# Patient Record
Sex: Female | Born: 1935 | Race: White | Hispanic: No | Smoking: Former smoker
Health system: Southern US, Community
[De-identification: ages and names within clinical notes are randomized; demographics above are authoritative.]

## PROBLEM LIST (undated history)

## (undated) DIAGNOSIS — K589 Irritable bowel syndrome without diarrhea: Secondary | ICD-10-CM

## (undated) DIAGNOSIS — B019 Varicella without complication: Secondary | ICD-10-CM

## (undated) DIAGNOSIS — J449 Chronic obstructive pulmonary disease, unspecified: Secondary | ICD-10-CM

## (undated) DIAGNOSIS — G250 Essential tremor: Secondary | ICD-10-CM

## (undated) DIAGNOSIS — I10 Essential (primary) hypertension: Secondary | ICD-10-CM

## (undated) DIAGNOSIS — F419 Anxiety disorder, unspecified: Secondary | ICD-10-CM

## (undated) HISTORY — PX: ABDOMINAL HYSTERECTOMY: SHX81

## (undated) HISTORY — DX: Irritable bowel syndrome, unspecified: K58.9

## (undated) HISTORY — PX: APPENDECTOMY: SHX54

## (undated) HISTORY — PX: OVARIAN CYST REMOVAL: SHX89

## (undated) HISTORY — PX: CHOLECYSTECTOMY: SHX55

## (undated) HISTORY — PX: SMALL INTESTINE SURGERY: SHX150

## (undated) HISTORY — DX: Varicella without complication: B01.9

---

## 2005-06-24 ENCOUNTER — Ambulatory Visit: Payer: Self-pay | Admitting: Family Medicine

## 2006-06-25 ENCOUNTER — Ambulatory Visit: Payer: Self-pay | Admitting: Family Medicine

## 2006-10-27 ENCOUNTER — Ambulatory Visit: Payer: Self-pay | Admitting: Specialist

## 2007-01-14 ENCOUNTER — Ambulatory Visit: Payer: Self-pay | Admitting: Unknown Physician Specialty

## 2007-06-11 ENCOUNTER — Other Ambulatory Visit: Payer: Self-pay

## 2007-06-11 ENCOUNTER — Emergency Department: Payer: Self-pay

## 2007-06-15 ENCOUNTER — Emergency Department: Payer: Self-pay | Admitting: Emergency Medicine

## 2007-06-25 ENCOUNTER — Ambulatory Visit: Payer: Self-pay | Admitting: Vascular Surgery

## 2007-07-01 ENCOUNTER — Other Ambulatory Visit: Payer: Self-pay

## 2007-07-01 ENCOUNTER — Ambulatory Visit: Payer: Self-pay | Admitting: Vascular Surgery

## 2007-08-13 ENCOUNTER — Ambulatory Visit: Payer: Self-pay | Admitting: Family Medicine

## 2007-09-11 ENCOUNTER — Ambulatory Visit: Payer: Self-pay | Admitting: Gynecologic Oncology

## 2007-09-22 ENCOUNTER — Ambulatory Visit: Payer: Self-pay | Admitting: Gynecologic Oncology

## 2007-11-09 ENCOUNTER — Ambulatory Visit: Payer: Self-pay | Admitting: Unknown Physician Specialty

## 2007-11-17 ENCOUNTER — Inpatient Hospital Stay: Payer: Self-pay | Admitting: Unknown Physician Specialty

## 2007-12-08 ENCOUNTER — Ambulatory Visit: Payer: Self-pay | Admitting: Unknown Physician Specialty

## 2008-08-15 ENCOUNTER — Ambulatory Visit: Payer: Self-pay | Admitting: Family Medicine

## 2008-08-17 ENCOUNTER — Ambulatory Visit: Payer: Self-pay | Admitting: Family Medicine

## 2008-10-19 DIAGNOSIS — C4490 Unspecified malignant neoplasm of skin, unspecified: Secondary | ICD-10-CM

## 2008-10-19 HISTORY — DX: Unspecified malignant neoplasm of skin, unspecified: C44.90

## 2009-02-15 ENCOUNTER — Ambulatory Visit: Payer: Self-pay | Admitting: Family Medicine

## 2009-09-19 ENCOUNTER — Emergency Department: Payer: Self-pay | Admitting: Emergency Medicine

## 2010-01-21 ENCOUNTER — Emergency Department: Payer: Self-pay | Admitting: Emergency Medicine

## 2010-03-01 ENCOUNTER — Ambulatory Visit: Payer: Self-pay | Admitting: Family Medicine

## 2010-09-02 ENCOUNTER — Emergency Department: Payer: Self-pay | Admitting: Unknown Physician Specialty

## 2011-04-25 ENCOUNTER — Ambulatory Visit: Payer: Self-pay | Admitting: Family Medicine

## 2012-05-05 ENCOUNTER — Ambulatory Visit: Payer: Self-pay | Admitting: Family Medicine

## 2012-05-07 ENCOUNTER — Ambulatory Visit: Payer: Self-pay | Admitting: Family Medicine

## 2012-12-05 ENCOUNTER — Emergency Department: Payer: Self-pay | Admitting: Emergency Medicine

## 2012-12-05 LAB — CBC
MCHC: 33.2 g/dL (ref 32.0–36.0)
MCV: 90 fL (ref 80–100)
Platelet: 276 10*3/uL (ref 150–440)
RDW: 13 % (ref 11.5–14.5)
WBC: 8 10*3/uL (ref 3.6–11.0)

## 2012-12-05 LAB — BASIC METABOLIC PANEL
Calcium, Total: 10 mg/dL (ref 8.5–10.1)
Chloride: 101 mmol/L (ref 98–107)
Co2: 34 mmol/L — ABNORMAL HIGH (ref 21–32)
EGFR (African American): >60
Osmolality: 282 (ref 275–301)
Potassium: 3.5 mmol/L (ref 3.5–5.1)
Sodium: 139 mmol/L (ref 136–145)

## 2012-12-05 LAB — CK TOTAL AND CKMB (NOT AT ARMC)
CK, Total: 102 U/L (ref 21–215)
CK-MB: 1.2 ng/mL (ref 0.5–3.6)

## 2012-12-05 LAB — TROPONIN I: Troponin-I: <0.02 ng/mL

## 2013-06-01 ENCOUNTER — Ambulatory Visit: Payer: Self-pay | Admitting: Family Medicine

## 2013-06-08 ENCOUNTER — Ambulatory Visit: Payer: Self-pay | Admitting: Family Medicine

## 2013-12-24 ENCOUNTER — Ambulatory Visit: Payer: Self-pay | Admitting: Family Medicine

## 2014-12-26 ENCOUNTER — Ambulatory Visit: Payer: Self-pay | Admitting: Family Medicine

## 2015-01-16 ENCOUNTER — Ambulatory Visit: Payer: Self-pay | Admitting: Physician Assistant

## 2015-05-23 ENCOUNTER — Emergency Department: Payer: Medicare Other

## 2015-05-23 ENCOUNTER — Encounter: Payer: Self-pay | Admitting: Emergency Medicine

## 2015-05-23 DIAGNOSIS — R0602 Shortness of breath: Secondary | ICD-10-CM | POA: Diagnosis present

## 2015-05-23 DIAGNOSIS — Z87891 Personal history of nicotine dependence: Secondary | ICD-10-CM | POA: Insufficient documentation

## 2015-05-23 DIAGNOSIS — I10 Essential (primary) hypertension: Secondary | ICD-10-CM | POA: Diagnosis not present

## 2015-05-23 DIAGNOSIS — J441 Chronic obstructive pulmonary disease with (acute) exacerbation: Secondary | ICD-10-CM | POA: Diagnosis not present

## 2015-05-23 LAB — CBC WITH DIFFERENTIAL/PLATELET
Basophils Absolute: 0.1 10*3/uL (ref 0–0.1)
Basophils Relative: 1 %
EOS PCT: 1 %
Eosinophils Absolute: 0.1 10*3/uL (ref 0–0.7)
HEMATOCRIT: 38.5 % (ref 35.0–47.0)
HEMOGLOBIN: 12.6 g/dL (ref 12.0–16.0)
LYMPHS PCT: 30 %
Lymphs Abs: 2.5 10*3/uL (ref 1.0–3.6)
MCH: 28.9 pg (ref 26.0–34.0)
MCHC: 32.7 g/dL (ref 32.0–36.0)
MCV: 88.4 fL (ref 80.0–100.0)
MONO ABS: 0.6 10*3/uL (ref 0.2–0.9)
MONOS PCT: 8 %
Neutro Abs: 5.1 10*3/uL (ref 1.4–6.5)
Neutrophils Relative %: 60 %
PLATELETS: 321 10*3/uL (ref 150–440)
RBC: 4.36 MIL/uL (ref 3.80–5.20)
RDW: 13.6 % (ref 11.5–14.5)
WBC: 8.3 10*3/uL (ref 3.6–11.0)
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
## 2015-05-24 ENCOUNTER — Emergency Department
Admission: EM | Admit: 2015-05-24 | Discharge: 2015-05-24 | Disposition: A | Discharge status: Home / Self Care | Payer: Medicare Other | Attending: Emergency Medicine | Admitting: Emergency Medicine

## 2015-05-24 DIAGNOSIS — J441 Chronic obstructive pulmonary disease with (acute) exacerbation: Secondary | ICD-10-CM

## 2015-05-24 HISTORY — DX: Hypomagnesemia: E83.42

## 2015-05-24 HISTORY — DX: Essential (primary) hypertension: I10

## 2015-05-24 HISTORY — DX: Anxiety disorder, unspecified: F41.9

## 2015-05-24 HISTORY — DX: Essential tremor: G25.0

## 2015-05-24 HISTORY — DX: Chronic obstructive pulmonary disease, unspecified: J44.9

## 2015-05-24 LAB — COMPREHENSIVE METABOLIC PANEL
ALT: 11 U/L — AB (ref 14–54)
AST: 20 U/L (ref 15–41)
Albumin: 3.8 g/dL (ref 3.5–5.0)
Alkaline Phosphatase: 59 U/L (ref 38–126)
Anion gap: 11 (ref 5–15)
BUN: 25 mg/dL — AB (ref 6–20)
CO2: 27 mmol/L (ref 22–32)
Calcium: 9.1 mg/dL (ref 8.9–10.3)
Chloride: 100 mmol/L — ABNORMAL LOW (ref 101–111)
Creatinine, Ser: 1.74 mg/dL — ABNORMAL HIGH (ref 0.44–1.00)
GFR calc Af Amer: 31 mL/min — ABNORMAL LOW (ref >60)
GFR calc non Af Amer: 27 mL/min — ABNORMAL LOW (ref >60)
Glucose, Bld: 133 mg/dL — ABNORMAL HIGH (ref 65–99)
POTASSIUM: 3.4 mmol/L — AB (ref 3.5–5.1)
Sodium: 138 mmol/L (ref 135–145)
Total Bilirubin: 0.6 mg/dL (ref 0.3–1.2)
Total Protein: 6.8 g/dL (ref 6.5–8.1)

## 2015-05-24 LAB — TROPONIN I: Troponin I: <0.03 ng/mL (ref <0.031)

## 2015-05-24 MED ORDER — IPRATROPIUM-ALBUTEROL 0.5-2.5 (3) MG/3ML IN SOLN
RESPIRATORY_TRACT | Status: AC
  Administered 2015-05-24: 02:00:00
  Filled 2015-05-24: qty 3
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
## 2015-05-24 NOTE — Discharge Instructions (Signed)

## 2015-12-06 ENCOUNTER — Other Ambulatory Visit: Payer: Self-pay | Admitting: Family Medicine

## 2015-12-06 DIAGNOSIS — Z1231 Encounter for screening mammogram for malignant neoplasm of breast: Secondary | ICD-10-CM

## 2015-12-07 ENCOUNTER — Other Ambulatory Visit: Payer: Self-pay | Admitting: Family Medicine

## 2015-12-07 DIAGNOSIS — Z1239 Encounter for other screening for malignant neoplasm of breast: Secondary | ICD-10-CM

## 2015-12-28 ENCOUNTER — Ambulatory Visit: Payer: Medicare Other | Attending: Family Medicine

## 2016-01-03 ENCOUNTER — Ambulatory Visit
Admission: RE | Admit: 2016-01-03 | Discharge: 2016-01-03 | Disposition: A | Discharge status: Home / Self Care | Payer: Medicare Other | Source: Ambulatory Visit | Attending: Family Medicine | Admitting: Family Medicine

## 2016-01-03 DIAGNOSIS — Z1231 Encounter for screening mammogram for malignant neoplasm of breast: Secondary | ICD-10-CM | POA: Diagnosis not present

## 2016-01-03 DIAGNOSIS — Z1239 Encounter for other screening for malignant neoplasm of breast: Secondary | ICD-10-CM

## 2016-02-09 ENCOUNTER — Other Ambulatory Visit: Payer: Self-pay

## 2016-02-09 ENCOUNTER — Encounter: Payer: Self-pay | Admitting: Emergency Medicine

## 2016-02-09 ENCOUNTER — Emergency Department: Payer: Medicare Other

## 2016-02-09 ENCOUNTER — Emergency Department
Admission: EM | Admit: 2016-02-09 | Discharge: 2016-02-09 | Disposition: A | Discharge status: Home / Self Care | Payer: Medicare Other | Attending: Emergency Medicine | Admitting: Emergency Medicine

## 2016-02-09 DIAGNOSIS — S299XXA Unspecified injury of thorax, initial encounter: Secondary | ICD-10-CM | POA: Insufficient documentation

## 2016-02-09 DIAGNOSIS — M7711 Lateral epicondylitis, right elbow: Secondary | ICD-10-CM | POA: Insufficient documentation

## 2016-02-09 DIAGNOSIS — R42 Dizziness and giddiness: Secondary | ICD-10-CM | POA: Insufficient documentation

## 2016-02-09 DIAGNOSIS — W1839XA Other fall on same level, initial encounter: Secondary | ICD-10-CM | POA: Insufficient documentation

## 2016-02-09 DIAGNOSIS — Z87891 Personal history of nicotine dependence: Secondary | ICD-10-CM | POA: Insufficient documentation

## 2016-02-09 DIAGNOSIS — M7712 Lateral epicondylitis, left elbow: Secondary | ICD-10-CM | POA: Diagnosis not present

## 2016-02-09 DIAGNOSIS — Z23 Encounter for immunization: Secondary | ICD-10-CM | POA: Diagnosis not present

## 2016-02-09 DIAGNOSIS — I10 Essential (primary) hypertension: Secondary | ICD-10-CM | POA: Insufficient documentation

## 2016-02-09 DIAGNOSIS — Y9289 Other specified places as the place of occurrence of the external cause: Secondary | ICD-10-CM | POA: Insufficient documentation

## 2016-02-09 DIAGNOSIS — S59902A Unspecified injury of left elbow, initial encounter: Secondary | ICD-10-CM | POA: Diagnosis present

## 2016-02-09 DIAGNOSIS — Y9389 Activity, other specified: Secondary | ICD-10-CM | POA: Insufficient documentation

## 2016-02-09 DIAGNOSIS — S51011A Laceration without foreign body of right elbow, initial encounter: Secondary | ICD-10-CM

## 2016-02-09 DIAGNOSIS — S51012A Laceration without foreign body of left elbow, initial encounter: Secondary | ICD-10-CM

## 2016-02-09 DIAGNOSIS — Y998 Other external cause status: Secondary | ICD-10-CM | POA: Diagnosis not present

## 2016-02-09 MED ORDER — TETANUS-DIPHTH-ACELL PERTUSSIS 5-2.5-18.5 LF-MCG/0.5 IM SUSP
0.5000 mL | Freq: Once | INTRAMUSCULAR | Status: AC
  Administered 2016-02-09: 0.5 mL via INTRAMUSCULAR
  Filled 2016-02-09: qty 0.5

## 2016-02-09 NOTE — ED Notes (Signed)
Reviewed d/c instructions, follow-up care, safe movement (ortostatics), signs of infection (laceration/skin tears) with pt. Pt verbalized understanding.

## 2016-02-09 NOTE — ED Notes (Signed)
Patient from home via [facility]. C/o fall. Patient states that she became dizzy when she got up too fast. States that she has the episodes frequently and has an appointment with her PCP regarding such. Patient denies LOC. Patient has bilateral arm skin tears and back pain.

## 2016-02-09 NOTE — Discharge Instructions (Signed)
Dizziness Dizziness is a common problem. It is a feeling of unsteadiness or light-headedness. You may feel like you are about to faint. Dizziness can lead to injury if you stumble or fall. Anyone can become dizzy, but dizziness is more common in older adults. This condition can be caused by a number of things, including medicines, dehydration, or illness. HOME CARE INSTRUCTIONS Taking these steps may help with your condition: Eating and Drinking  Drink enough fluid to keep your urine clear or pale yellow. This helps to keep you from becoming dehydrated. Try to drink more clear fluids, such as water.  Do not drink alcohol.  Limit your caffeine intake if directed by your health care provider.  Limit your salt intake if directed by your health care provider. Activity  Avoid making quick movements.  Rise slowly from chairs and steady yourself until you feel okay.  In the morning, first sit up on the side of the bed. When you feel okay, stand slowly while you hold onto something until you know that your balance is fine.  Move your legs often if you need to stand in one place for a [place] time. Tighten and relax your muscles in your legs while you are standing.  Do not drive or operate heavy machinery if you feel dizzy.  Avoid bending down if you feel dizzy. Place items in your home so that they are easy for you to reach without leaning over. Lifestyle  Do not use any tobacco products, including cigarettes, chewing tobacco, or electronic cigarettes. If you need help quitting, ask your health care provider.  Try to reduce your stress level, such as with yoga or meditation. Talk with your health care provider if you need help. General Instructions  Watch your dizziness for any changes.  Take medicines only as directed by your health care provider. Talk with your health care provider if you think that your dizziness is caused by a medicine that you are taking.  Tell a friend or a family  member that you are feeling dizzy. If he or she notices any changes in your behavior, have this person call your health care provider.  Keep all follow-up visits as directed by your health care provider. This is important. SEEK MEDICAL CARE IF:  Your dizziness does not go away.  Your dizziness or light-headedness gets worse.  You feel nauseous.  You have reduced hearing.  You have new symptoms.  You are unsteady on your feet or you feel like the room is spinning. SEEK IMMEDIATE MEDICAL CARE IF:  You vomit or have diarrhea and are unable to eat or drink anything.  You have problems talking, walking, swallowing, or using your arms, hands, or legs.  You feel generally weak.  You are not thinking clearly or you have trouble forming sentences. It may take a friend or family member to notice this.  You have chest pain, abdominal pain, shortness of breath, or sweating.  Your vision changes.  You notice any bleeding.  You have a headache.  You have neck pain or a stiff neck.  You have a fever.   This information is not intended to replace advice given to you by your health care provider. Make sure you discuss any questions you have with your health care provider.   Document Released: 05/28/2001 Document Revised: 04/18/2015 Document Reviewed: 11/28/2014 Elsevier Interactive Patient Education 2016 [place], Adult A laceration is a cut that goes through all of the layers of the skin  and into the tissue that is right under the skin. Some lacerations heal on their own. Others need to be closed with stitches (sutures), staples, skin adhesive strips, or skin glue. Proper laceration care minimizes the risk of infection and helps the laceration to heal better. HOW TO CARE FOR YOUR LACERATION If sutures or staples were used:  Keep the wound clean and dry.  If you were given a bandage (dressing), you should change it at least one time per day or as told by your  health care provider. You should also change it if it becomes wet or dirty.  Keep the wound completely dry for the first 24 hours or as told by your health care provider. After that time, you may shower or bathe. However, make sure that the wound is not soaked in water until after the sutures or staples have been removed.  Clean the wound one time each day or as told by your health care provider:  Wash the wound with soap and water.  Rinse the wound with water to remove all soap.  Pat the wound dry with a clean towel. Do not rub the wound.  After cleaning the wound, apply a thin layer of antibiotic ointmentas told by your health care provider. This will help to prevent infection and keep the dressing from sticking to the wound.  Have the sutures or staples removed as told by your health care provider. If skin adhesive strips were used:  Keep the wound clean and dry.  If you were given a bandage (dressing), you should change it at least one time per day or as told by your health care provider. You should also change it if it becomes dirty or wet.  Do not get the skin adhesive strips wet. You may shower or bathe, but be careful to keep the wound dry.  If the wound gets wet, pat it dry with a clean towel. Do not rub the wound.  Skin adhesive strips fall off on their own. You may trim the strips as the wound heals. Do not remove skin adhesive strips that are still stuck to the wound. They will fall off in time. If skin glue was used:  Try to keep the wound dry, but you may briefly wet it in the shower or bath. Do not soak the wound in water, such as by swimming.  After you have showered or bathed, gently pat the wound dry with a clean towel. Do not rub the wound.  Do not do any activities that will make you sweat heavily until the skin glue has fallen off on its own.  Do not apply liquid, cream, or ointment medicine to the wound while the skin glue is in place. Using those may loosen  the film before the wound has healed.  If you were given a bandage (dressing), you should change it at least one time per day or as told by your health care provider. You should also change it if it becomes dirty or wet.  If a dressing is placed over the wound, be careful not to apply tape directly over the skin glue. Doing that may cause the glue to be pulled off before the wound has healed.  Do not pick at the glue. The skin glue usually remains in place for 5-10 days, then it falls off of the skin. General Instructions  Take over-the-counter and prescription medicines only as told by your health care provider.  If you were prescribed an antibiotic medicine  or ointment, take or apply it as told by your doctor. Do not stop using it even if your condition improves.  To help prevent scarring, make sure to cover your wound with sunscreen whenever you are outside after stitches are removed, after adhesive strips are removed, or when glue remains in place and the wound is healed. Make sure to wear a sunscreen of at least 30 SPF.  Do not scratch or pick at the wound.  Keep all follow-up visits as told by your health care provider. This is important.  Check your wound every day for signs of infection. Watch for:  Redness, swelling, or pain.  Fluid, blood, or pus.  Raise (elevate) the injured area above the level of your heart while you are sitting or lying down, if possible. SEEK MEDICAL CARE IF:  You received a tetanus shot and you have swelling, severe pain, redness, or bleeding at the injection site.  You have a fever.  A wound that was closed breaks open.  You notice a bad smell coming from your wound or your dressing.  You notice something coming out of the wound, such as wood or glass.  Your pain is not controlled with medicine.  You have increased redness, swelling, or pain at the site of your wound.  You have fluid, blood, or pus coming from your wound.  You notice a  change in the color of your skin near your wound.  You need to change the dressing frequently due to fluid, blood, or pus draining from the wound.  You develop a new rash.  You develop numbness around the wound. SEEK IMMEDIATE MEDICAL CARE IF:  You develop severe swelling around the wound.  Your pain suddenly increases and is severe.  You develop painful lumps near the wound or on skin that is anywhere on your body.  You have a red streak going away from your wound.  The wound is on your hand or foot and you cannot properly move a finger or toe.  The wound is on your hand or foot and you notice that your fingers or toes look pale or bluish.   This information is not intended to replace advice given to you by your health care provider. Make sure you discuss any questions you have with your health care provider.   Document Released: 12/02/2005 Document Revised: 04/18/2015 Document Reviewed: 11/28/2014 Elsevier Interactive Patient Education 2016 [employer]  Skin Tear Care A skin tear is a wound in which the top layer of skin has peeled off. This is a common problem with aging because the skin becomes thinner and more fragile as a person gets older. In addition, some medicines, such as oral corticosteroids, can lead to skin thinning if taken for [place] periods of time.  A skin tear is often repaired with tape or skin adhesive strips. This keeps the skin that has been peeled off in contact with the healthier skin beneath. Depending on the location of the wound, a bandage (dressing) may be applied over the tape or skin adhesive strips. Sometimes, during the healing process, the skin turns black and dies. Even when this happens, the torn skin acts as a good dressing until the skin underneath gets healthier and repairs itself. HOME CARE INSTRUCTIONS   Change dressings once per day or as directed by your caregiver.  Gently clean the skin tear and the area around the tear using saline  solution or mild soap and water.  Do not rub the injured skin  dry. Let the area air dry.  Apply petroleum jelly or an antibiotic cream or ointment to keep the tear moist. This will help the wound heal. Do not allow a scab to form.  If the dressing sticks before the next dressing change, moisten it with warm soapy water and gently remove it.  Protect the injured skin until it has healed.  Only take over-the-counter or prescription medicines as directed by your caregiver.  Take showers or baths using warm soapy water. Apply a new dressing after the shower or bath.  Keep all follow-up appointments as directed by your caregiver.  SEEK IMMEDIATE MEDICAL CARE IF:   You have redness, swelling, or increasing pain in the skin tear.  You havepus coming from the skin tear.  You have chills.  You have a red streak that goes away from the skin tear.  You have a bad smell coming from the tear or dressing.  You have a fever or persistent symptoms for more than 2-3 days.  You have a fever and your symptoms suddenly get worse. MAKE SURE YOU:  Understand these instructions.  Will watch this condition.  Will get help right away if your child is not doing well or gets worse.   This information is not intended to replace advice given to you by your health care provider. Make sure you discuss any questions you have with your health care provider.   Document Released: 08/27/2001 Document Revised: 08/26/2012 Document Reviewed: 06/15/2012 Elsevier Interactive Patient Education 2016 [place].  Sterile Tape Wound Care Some cuts and wounds can be closed using sterile tape, also called skin adhesive strips. Skin adhesive strips can be used for shallow (superficial) and simple cuts, wounds, lacerations, and surgical incisions. These strips act in place of stitches to hold the edges of the wound together, allowing for faster healing. Unlike stitches, the adhesive strips do not require needles or  anesthetic medicine for placement. The strips will wear off naturally as the wound is healing. It is important to take proper care of your wound at home while it heals.  HOME CARE INSTRUCTIONS  Try to keep the area around your wound clean and dry. Do not allow the adhesive strips to get wet for the first 12 hours.   Do not use any soaps or ointments on the wound for the first 12 hours.   If a bandage (dressing) has been applied, follow your health care provider's instructions for how often to change the dressing. Keep the dressing dry if one has been applied.   Do not remove the adhesive strips. They will fall off on their own. If they do not, you may remove them gently after 10 days. You should gently wet the strips before removing them. For example, this can be done in the shower.  Do not scratch, pick, or rub the wound area.   Protect the wound from further injury until it is healed.   Protect the wound from sun and tanning bed exposure while it is healing and for several weeks after healing.   Only take over-the-counter or prescription medicines as directed by your health care provider.   Keep all follow-up appointments as directed by your health care provider.  SEEK MEDICAL CARE IF: Your adhesive strips become wet or soaked with blood before the wound has healed. The tape will need to be replaced.  SEEK IMMEDIATE MEDICAL CARE IF:  You have increasing pain in the wound.   You develop a rash after the strips  are applied.  Your wound becomes red, swollen, hot, or tender.   You have a red streak that goes away from the wound.   You have pus coming from the wound.   You have increased bleeding from the wound.  You notice a bad smell coming from the wound.   Your wound breaks open. MAKE SURE YOU:  Understand these instructions.  Will watch your condition.  Will get help right away if you are not doing well or get worse.   This information is not intended to  replace advice given to you by your health care provider. Make sure you discuss any questions you have with your health care provider.   Document Released: 01/09/2005 Document Revised: 12/23/2014 Document Reviewed: 06/23/2013 Elsevier Interactive Patient Education [employer].

## 2016-02-09 NOTE — ED Provider Notes (Signed)
[facility] Emergency Department Provider Note  ____________________________________________  Time seen: 6:40pm  I have reviewed the triage vital signs and the nursing notes.   HISTORY  Chief Complaint Fall    HPI Kristen Bailey is a 82 y.o. female who was sitting on her couch when her phone rang.  She got up quickly, causing her to get lightheaded and fall down.  She caomplains of pain in b/l elbows.  No head injury, syncope, neck pain. No preceding pain.  Has h/o of dizziness with rapid change in position. Has been eating and drinking normally and in usual state of health.      Past Medical History  Diagnosis Date  . COPD (chronic obstructive pulmonary disease) ([place])   . Hypertension   . Anxiety   . Essential tremor   . Hypomagnesemia      There are no active problems to display for this patient.    Past Surgical History  Procedure Laterality Date  . Abdominal hysterectomy    . Cholecystectomy    . Ovarian cyst removal       No current outpatient prescriptions on file.   Allergies Codeine; Lipitor; Omeprazole; Vicodin; and Zyban   History reviewed. No pertinent family history.  Social History Social History  Substance Use Topics  . Smoking status: Former Research scientist (life sciences)  . Smokeless tobacco: Never Used  . Alcohol Use: No    Review of Systems  Constitutional:   No fever or chills. No weight changes Eyes:   No blurry vision or double vision.  ENT:   No sore throat.  Cardiovascular:   No chest pain. Respiratory:   No dyspnea or cough. Gastrointestinal:   Negative for abdominal pain, vomiting and diarrhea.  No BRBPR or melena. Genitourinary:   Negative for dysuria or difficulty urinating. Musculoskeletal:   bilateral elbow pain and upper back pain after fall.. Skin:   Negative for rash. Neurological:   Negative for headaches, focal weakness or numbness. Psychiatric:  No anxiety or depression.   Endocrine:  No changes in  energy or sleep difficulty.  10-point ROS otherwise negative.  ____________________________________________   PHYSICAL EXAM:  VITAL SIGNS: ED Triage Vitals  Enc Vitals Group     BP 02/09/16 1918 165/59 mmHg     Pulse Rate 02/09/16 1918 88     Resp 02/09/16 1918 18     Temp 02/09/16 1918 98.6 F (37 C)     Temp Source 02/09/16 1918 Oral     SpO2 02/09/16 1918 98 %     Weight 02/09/16 1918 152 lb (68.947 kg)     Height 02/09/16 1918 5\' 2"  (1.575 m)     Head Cir --      Peak Flow --      Pain Score 02/09/16 1851 4     Pain Loc --      Pain Edu? --      Excl. in [place]? --     Vital signs reviewed, nursing assessments reviewed.   Constitutional:   Alert and oriented. Well appearing and in no distress. Eyes:   No scleral icterus. No conjunctival pallor. PERRL. EOMI ENT   Head:   Normocephalic and atraumatic.   Nose:   No congestion/rhinnorhea. No septal hematoma   Mouth/Throat:   MMM, no pharyngeal erythema. No peritonsillar mass.    Neck:   No stridor. No SubQ emphysema. No meningismus. Hematological/Lymphatic/Immunilogical:   No cervical lymphadenopathy. Cardiovascular:   RRR. Symmetric bilateral radial and DP pulses.  No murmurs.  Respiratory:   Normal respiratory effort without tachypnea nor retractions. Breath sounds are clear and equal bilaterally. No wheezes/rales/rhonchi. Gastrointestinal:   Soft and nontender. Non distended. There is no CVA tenderness.  No rebound, rigidity, or guarding. Genitourinary:   deferred Musculoskeletal:   Tenderness at right elbow epicondyles and left elbow epicondyles. Full range of motion of both elbows. Extremities otherwise stable and unremarkable.There is some midline spinal tenderness around T10 but otherwise unremarkable for range of motion in the neck. Neurologic:   Normal speech and language.  CN 2-10 normal. Motor grossly intact.ormal gait No gross focal neurologic deficits are appreciated.  Skin:    Skin is warm,  dryith 2 cm laceration over the extensor surface of the right elbow and 4 cm laceration of the extensor surface of the left elbow both are linear, superficial skin tear flaps.. No rash noted.  No petechiae, purpura, or bullae. Psychiatric:   Mood and affect are normal. ____________________________________________    LABS (pertinent positives/negatives) (all labs ordered are listed, but only abnormal results are displayed) Labs Reviewed - No data to display ____________________________________________   EKG  Interpreted by me  Date: 02/09/2016  Rate: 85  Rhythm: normal sinus rhythm  QRS Axis: normal  Intervals: normal  ST/T Wave abnormalities: normal  Conduction Disutrbances: none  Narrative Interpretation: unremarkable      ____________________________________________    RADIOLOGY  xr l elbow = nad xr r elbow = nad xr T spine = nad   ____________________________________________   PROCEDURES LACERATION REPAIR Performed by: Joni Fears, [place] by: Carrie Mew Consent: Verbal consent obtained. Risks and benefits: risks, benefits and alternatives were discussed Consent given by: patient Patient identity confirmed: provided demographic data  Wound explored  Laceration Location: R elbow extensor surface  Laceration Length: 2cm Superficial, linear skin tear/flap  No Foreign Bodies seen or palpated  Anesthesia:none Skin closure: steri strips  Number of sutures: 1  Technique: topical application with wound edge reapproximation  Patient tolerance: Patient tolerated the procedure well with no immediate complications.    LACERATION REPAIR Performed by: Carrie Mew Authorized by: Carrie Mew Consent: Verbal consent obtained. Risks and benefits: risks, benefits and alternatives were discussed Consent given by: patient Patient identity confirmed: provided demographic data Wound explored   Superficial skin tear/flap Laceration  Location: L elbow extensor surface  Laceration Length: 4cm  No Foreign Bodies seen or palpated  Anesthesia:none   Skin closure: steri strips  Number of sutures: 3  Technique: topical application with wound edge reapproximation  Patient tolerance: Patient tolerated the procedure well with no immediate complications.   ____________________________________________   INITIAL IMPRESSION / ASSESSMENT AND PLAN / ED COURSE  Pertinent labs & imaging results that were available during my care of the patient were reviewed by me and considered in my medical decision making (see chart for details).  Patient well appearing no acute distress. Appears to have had afall related to orthostatic dizziness which is typical for her. She had a rapid change in position. She is otherwise in her usual state of health, eating and drinking normally, no other acute symptoms No preceding or after the fact symptoms to suggest any kind of acute neurologic or cardiovascular or pulmonary event. We'll discharge home. Wound care provided,all wounds reapproximated and dressed. Follow-up with PCP.     ____________________________________________   FINAL CLINICAL IMPRESSION(S) / ED DIAGNOSES  Final diagnoses:  Orthostatic dizziness  Elbow laceration, left, initial encounter  Elbow laceration, right, initial encounter  Carrie Mew, MD 02/09/16 [id number]

## 2016-10-07 ENCOUNTER — Other Ambulatory Visit: Payer: Self-pay | Admitting: Internal Medicine

## 2016-10-07 DIAGNOSIS — Z1231 Encounter for screening mammogram for malignant neoplasm of breast: Secondary | ICD-10-CM

## 2016-11-22 ENCOUNTER — Ambulatory Visit (INDEPENDENT_AMBULATORY_CARE_PROVIDER_SITE_OTHER): Payer: Medicare Other | Admitting: Vascular Surgery

## 2016-11-22 ENCOUNTER — Encounter (INDEPENDENT_AMBULATORY_CARE_PROVIDER_SITE_OTHER): Payer: Self-pay | Admitting: Vascular Surgery

## 2016-11-22 ENCOUNTER — Encounter (INDEPENDENT_AMBULATORY_CARE_PROVIDER_SITE_OTHER): Payer: Self-pay

## 2016-11-22 ENCOUNTER — Ambulatory Visit (INDEPENDENT_AMBULATORY_CARE_PROVIDER_SITE_OTHER): Payer: Medicare Other

## 2016-11-22 VITALS — BP 136/75 | HR 93 | Resp 17 | Ht 62.5 in | Wt 148.6 lb

## 2016-11-22 DIAGNOSIS — E785 Hyperlipidemia, unspecified: Secondary | ICD-10-CM | POA: Diagnosis not present

## 2016-11-22 DIAGNOSIS — I10 Essential (primary) hypertension: Secondary | ICD-10-CM | POA: Diagnosis not present

## 2016-11-22 DIAGNOSIS — I739 Peripheral vascular disease, unspecified: Secondary | ICD-10-CM

## 2016-11-22 DIAGNOSIS — M79604 Pain in right leg: Secondary | ICD-10-CM

## 2016-11-22 DIAGNOSIS — M79609 Pain in unspecified limb: Secondary | ICD-10-CM | POA: Insufficient documentation

## 2016-11-22 DIAGNOSIS — M79605 Pain in left leg: Secondary | ICD-10-CM | POA: Diagnosis not present

## 2016-11-22 DIAGNOSIS — J449 Chronic obstructive pulmonary disease, unspecified: Secondary | ICD-10-CM | POA: Diagnosis not present

## 2016-11-22 NOTE — Assessment & Plan Note (Signed)
Given the findings of her noninvasive studies, I doubt this is secondary to arterial insufficiency. Neurogenic pain is more likely the culprit. Arthritis may also be contributing.

## 2016-11-22 NOTE — Assessment & Plan Note (Signed)
lipid control important in reducing the progression of atherosclerotic disease. Continue statin therapy  

## 2016-11-22 NOTE — Assessment & Plan Note (Signed)
Her noninvasive studies today demonstrate an ABI of 1.18 on the right and 1.09 on the left with brisk triphasic waveforms. She has normal digital pressures and waveforms on the left and mildly reduced digital pressures and waveforms on the right. These findings would be consistent with mild small vessel peripheral arterial disease in the right lower extremity, but no major arterial insufficiency is present. She should continue her aspirin and statin regimen. This could be checked every 2-3 years in follow-up but no further invasive testing or intervention is warranted at this time.

## 2016-11-22 NOTE — Progress Notes (Signed)
Patient ID: Kristen Bailey, female   DOB: Jul 18, 1933, 82 y.o.   MRN: [id number]  Chief Complaint  Patient presents with  . New Patient (Initial Visit)    HPI Kristen Bailey is a 82 y.o. female.  I am asked to see the patient by Dr. Aundra Dubin for evaluation of leg pain and possible peripheral arterial disease.  The patient reports Pain in her legs including her feet and ankles. The pain sometimes wakes her at night. It radiates from her proximal leg downward. The right leg may be a little more affected than the left. She says she can actually walk okay and is more limited by her breathing and she has her legs with activity. She reports no ulceration or infection. This was gradual in its onset and had no clear inciting event or causative factor. She has a large number of atherosclerotic risk factors including previous tobacco use, hypertension, and hyperlipidemia. Her noninvasive studies today demonstrate an ABI of 1.18 on the right and 1.09 on the left with brisk triphasic waveforms. She has normal digital pressures and waveforms on the left and mildly reduced digital pressures and waveforms on the right. These findings would be consistent with mild small vessel peripheral arterial disease in the right lower extremity, but no major arterial insufficiency is present.   Past Medical History:  Diagnosis Date  . Anxiety   . COPD (chronic obstructive pulmonary disease) ([place])   . Essential tremor   . Hypertension   . Hypomagnesemia     Past Surgical History:  Procedure Laterality Date  . ABDOMINAL HYSTERECTOMY    . CHOLECYSTECTOMY    . OVARIAN CYST REMOVAL      Family History  Problem Relation Age of Onset  . Heart attack Mother   . Cancer Father      Social History Social History  Substance Use Topics  . Smoking status: Former Research scientist (life sciences)  . Smokeless tobacco: Never Used  . Alcohol use No    Allergies  Allergen Reactions  . Codeine   . Lipitor [Atorvastatin]   .  Omeprazole   . Vicodin [Hydrocodone-Acetaminophen]   . Zyban [Bupropion]     Current Outpatient Prescriptions  Medication Sig Dispense Refill  . acetaminophen (TYLENOL) 500 MG tablet Take 500 mg by mouth every 6 (six) hours as needed.    Marland Kitchen amLODipine (NORVASC) 5 MG tablet Take 5 mg by mouth daily.    Marland Kitchen aspirin EC 81 MG tablet Take by mouth.    . budesonide-formoterol (SYMBICORT) 160-4.5 MCG/ACT inhaler Inhale 2 puffs into the lungs 2 (two) times daily.    . calcium carbonate (OSCAL) 1500 (600 Ca) MG TABS tablet Take by mouth 2 (two) times daily with a meal.    . Cholecalciferol (VITAMIN D-1000 MAX ST) 1000 units tablet Take by mouth.    Marland Kitchen lisinopril-hydrochlorothiazide (PRINZIDE,ZESTORETIC) 20-25 MG tablet Take by mouth.    Marland Kitchen LORazepam (ATIVAN) 1 MG tablet Take by mouth.    . magnesium oxide (MAG-OX) 400 MG tablet Take by mouth.    . potassium chloride SA (K-DUR,KLOR-CON) 20 MEQ tablet Take by mouth.    . Probiotic Product (ALIGN PO) Take by mouth daily.    . simvastatin (ZOCOR) 20 MG tablet     . tiotropium (SPIRIVA) 18 MCG inhalation capsule Place 18 mcg into inhaler and inhale daily.     No current facility-administered medications for this visit.       REVIEW OF SYSTEMS (Negative unless checked)  Constitutional: [] Weight  loss  [] Fever  [] Chills Cardiac: [] Chest pain   [] Chest pressure   [] Palpitations   [] Shortness of breath when laying flat   [] Shortness of breath at rest   [] Shortness of breath with exertion. Vascular:  [x] Pain in legs with walking   [] Pain in legs at rest   [] Pain in legs when laying flat   [x] Claudication   [] Pain in feet when walking  [] Pain in feet at rest  [] Pain in feet when laying flat   [] History of DVT   [] Phlebitis   [] Swelling in legs   [] Varicose veins   [] Non-healing ulcers Pulmonary:   [] Uses home oxygen   [] Productive cough   [] Hemoptysis   [] Wheeze  [x] COPD   [] Asthma Neurologic:  [] Dizziness  [] Blackouts   [] Seizures   [] History of stroke    [] History of TIA  [] Aphasia   [] Temporary blindness   [] Dysphagia   [] Weakness or numbness in arms   [] Weakness or numbness in legs Musculoskeletal:  [x] Arthritis   [] Joint swelling   [x] Joint pain   [] Low back pain Hematologic:  [] Easy bruising  [] Easy bleeding   [] Hypercoagulable state   [] Anemic  [] Hepatitis Gastrointestinal:  [] Blood in stool   [] Vomiting blood  [] Gastroesophageal reflux/heartburn   [] Abdominal pain Genitourinary:  [] Chronic kidney disease   [] Difficult urination  [] Frequent urination  [] Burning with urination   [] Hematuria Skin:  [] Rashes   [] Ulcers   [] Wounds Psychological:  [] History of anxiety   []  History of major depression.    Physical Exam BP 136/75   Pulse 93   Resp 17   Ht 5' 2.5" (1.588 m)   Wt 148 lb 9.6 oz (67.4 kg)   BMI 26.75 kg/m  Gen:  WD/WN, NAD Head: [place]/AT, No temporalis wasting. Prominent temp pulse not noted. Ear/Nose/Throat: Hearing grossly intact, nares w/o erythema or drainage, oropharynx w/o Erythema/Exudate Eyes: Conjunctiva clear, sclera non-icteric  Neck: trachea midline.  No JVD.  Pulmonary:  Good air movement, Respirations not labored on supplemental oxygen Cardiac: RRR, normal S1, S2 Vascular:  Vessel Right Left  Radial Palpable Palpable  Ulnar Palpable Palpable  Brachial Palpable Palpable  Carotid Palpable, without bruit Palpable, without bruit  Aorta Not palpable N/A  Femoral Palpable Palpable  Popliteal Palpable Palpable  PT Palpable Palpable  DP 1+ Palpable 1+ Palpable   Gastrointestinal: soft, non-tender/non-distended. No guarding/reflex. No masses, surgical incisions, or scars. Musculoskeletal: M/S 5/5 throughout.  Extremities without ischemic changes.  No deformity or atrophy.  Neurologic: Sensation grossly intact in extremities.  Symmetrical.  Speech is fluent. Motor exam as listed above. Psychiatric: Judgment intact, Mood & affect appropriate for pt's clinical situation. Dermatologic: No rashes or ulcers noted.  No  cellulitis or open wounds. Lymph : No Cervical, Axillary, or Inguinal lymphadenopathy.   Radiology No results found.  Labs No results found for this or any previous visit (from the past 2160 hour(s)).  Assessment/Plan:  PAD (peripheral artery disease) ([place]) Her noninvasive studies today demonstrate an ABI of 1.18 on the right and 1.09 on the left with brisk triphasic waveforms. She has normal digital pressures and waveforms on the left and mildly reduced digital pressures and waveforms on the right. These findings would be consistent with mild small vessel peripheral arterial disease in the right lower extremity, but no major arterial insufficiency is present. She should continue her aspirin and statin regimen. This could be checked every 2-3 years in follow-up but no further invasive testing or intervention is warranted at this time.  Pain in limb Given  the findings of her noninvasive studies, I doubt this is secondary to arterial insufficiency. Neurogenic pain is more likely the culprit. Arthritis may also be contributing.  COPD (chronic obstructive pulmonary disease) (H[facility]) On oxygen therapy at this point which is more limiting than her leg pain.  Hyperlipidemia lipid control important in reducing the progression of atherosclerotic disease. Continue statin therapy   Essential hypertension blood pressure control important in reducing the progression of atherosclerotic disease. On appropriate oral medications.       Leotis Pain 11/22/2016, 12:21 PM   This note was created with Dragon medical transcription system.  Any errors from dictation are unintentional.

## 2016-11-22 NOTE — Assessment & Plan Note (Signed)
On oxygen therapy at this point which is more limiting than her leg pain.

## 2016-11-22 NOTE — Assessment & Plan Note (Signed)
blood pressure control important in reducing the progression of atherosclerotic disease. On appropriate oral medications.  

## 2017-01-07 ENCOUNTER — Ambulatory Visit
Admission: RE | Admit: 2017-01-07 | Discharge: 2017-01-07 | Disposition: A | Discharge status: Home / Self Care | Payer: Medicare Other | Source: Ambulatory Visit | Attending: Internal Medicine | Admitting: Internal Medicine

## 2017-01-07 DIAGNOSIS — Z1231 Encounter for screening mammogram for malignant neoplasm of breast: Secondary | ICD-10-CM | POA: Insufficient documentation

## 2017-06-24 ENCOUNTER — Emergency Department
Admission: EM | Admit: 2017-06-24 | Discharge: 2017-06-24 | Disposition: A | Discharge status: Home / Self Care | Payer: Medicare Other | Attending: Emergency Medicine | Admitting: Emergency Medicine

## 2017-06-24 ENCOUNTER — Encounter: Payer: Self-pay | Admitting: *Deleted

## 2017-06-24 DIAGNOSIS — Z87891 Personal history of nicotine dependence: Secondary | ICD-10-CM | POA: Insufficient documentation

## 2017-06-24 DIAGNOSIS — K649 Unspecified hemorrhoids: Secondary | ICD-10-CM | POA: Diagnosis not present

## 2017-06-24 DIAGNOSIS — Z79899 Other long term (current) drug therapy: Secondary | ICD-10-CM | POA: Insufficient documentation

## 2017-06-24 DIAGNOSIS — Z7982 Long term (current) use of aspirin: Secondary | ICD-10-CM | POA: Insufficient documentation

## 2017-06-24 DIAGNOSIS — K625 Hemorrhage of anus and rectum: Secondary | ICD-10-CM | POA: Diagnosis present

## 2017-06-24 DIAGNOSIS — J449 Chronic obstructive pulmonary disease, unspecified: Secondary | ICD-10-CM | POA: Insufficient documentation

## 2017-06-24 DIAGNOSIS — I10 Essential (primary) hypertension: Secondary | ICD-10-CM | POA: Diagnosis not present

## 2017-06-24 LAB — TYPE AND SCREEN
ABO/RH(D): O POS
Antibody Screen: NEGATIVE

## 2017-06-24 LAB — CBC
HCT: 39.8 % (ref 35.0–47.0)
Hemoglobin: 13.5 g/dL (ref 12.0–16.0)
MCH: 30.2 pg (ref 26.0–34.0)
MCHC: 34 g/dL (ref 32.0–36.0)
MCV: 89 fL (ref 80.0–100.0)
Platelets: 299 K/uL (ref 150–440)
RBC: 4.47 MIL/uL (ref 3.80–5.20)
RDW: 12.9 % (ref 11.5–14.5)
WBC: 6.2 K/uL (ref 3.6–11.0)

## 2017-06-24 LAB — COMPREHENSIVE METABOLIC PANEL WITH GFR
ALT: 10 U/L — ABNORMAL LOW (ref 14–54)
AST: 22 U/L (ref 15–41)
Albumin: 4.2 g/dL (ref 3.5–5.0)
Alkaline Phosphatase: 60 U/L (ref 38–126)
Anion gap: 11 (ref 5–15)
BUN: 39 mg/dL — ABNORMAL HIGH (ref 6–20)
CO2: 29 mmol/L (ref 22–32)
Calcium: 9.8 mg/dL (ref 8.9–10.3)
Chloride: 99 mmol/L — ABNORMAL LOW (ref 101–111)
Creatinine, Ser: 1.38 mg/dL — ABNORMAL HIGH (ref 0.44–1.00)
GFR calc Af Amer: 40 mL/min — ABNORMAL LOW
GFR calc non Af Amer: 35 mL/min — ABNORMAL LOW
Glucose, Bld: 141 mg/dL — ABNORMAL HIGH (ref 65–99)
Potassium: 3.7 mmol/L (ref 3.5–5.1)
Sodium: 139 mmol/L (ref 135–145)
Total Bilirubin: 1.6 mg/dL — ABNORMAL HIGH (ref 0.3–1.2)
Total Protein: 6.9 g/dL (ref 6.5–8.1)

## 2017-06-24 MED ORDER — PSYLLIUM 28 % PO PACK: 1.0000 | PACK | Freq: Two times a day (BID) | ORAL | 0 refills | Status: AC | PRN

## 2017-06-24 NOTE — ED Provider Notes (Signed)
[facility] Emergency Department Provider Note  ____________________________________________   First MD Initiated Contact with Patient 06/24/17 1003     (approximate)  I have reviewed the triage vital signs and the nursing notes.   HISTORY  Chief Complaint Rectal Bleeding   HPI Kristen Bailey is a 83 y.o. female with a history of COPD on oxygen as well as hemorrhoids was presenting to the emergency department today with multiple episodes of bright red blood per rectum. She says that when she is stooling over the past day that she is having bright red blood in the toilet bowl. She also reports small clots. However, she says the stool is her normal brown color. Says that the stools have been loose and hard intermittently. The patient says that she has a history of IBS and that she has had alternating hard and soft stools for some period of time it usually has not had bleeding. She has also history of colon resection years ago for multiple polyps including polyps that were "embedded" in her colon. Patient is not reporting any shortness of breath. Says that she is only having bleeding with her stools and says that there is no bleeding in between stools. She says that she is using preparation H and that she also tried a suppository over the past day to help move stools. She says that she sits on the toilet for about 45 minutes before passing any bowel movements. Also reports a mild amount of cramping to her lower abdomen before passing her movements but denies any pain at this time.   Past Medical History:  Diagnosis Date  . Anxiety   . COPD (chronic obstructive pulmonary disease) ([place])   . Essential tremor   . Hypertension   . Hypomagnesemia     Patient Active Problem List   Diagnosis Date Noted  . PAD (peripheral artery disease) ([place]) 11/22/2016  . Pain in limb 11/22/2016  . COPD (chronic obstructive pulmonary disease) ([place]) 11/22/2016  .  Hyperlipidemia 11/22/2016  . Essential hypertension 11/22/2016    Past Surgical History:  Procedure Laterality Date  . ABDOMINAL HYSTERECTOMY    . CHOLECYSTECTOMY    . OVARIAN CYST REMOVAL      Prior to Admission medications   Medication Sig Start Date End Date Taking? Authorizing Provider  acetaminophen (TYLENOL) 500 MG tablet Take 500 mg by mouth every 6 (six) hours as needed.    [provider]  amLODipine (NORVASC) 5 MG tablet Take 5 mg by mouth daily.    [provider]  aspirin EC 81 MG tablet Take by mouth.    [provider]  budesonide-formoterol (SYMBICORT) 160-4.5 MCG/ACT inhaler Inhale 2 puffs into the lungs 2 (two) times daily.    [provider]  calcium carbonate (OSCAL) 1500 (600 Ca) MG TABS tablet Take by mouth 2 (two) times daily with a meal.    [provider]  Cholecalciferol (VITAMIN D-1000 MAX ST) 1000 units tablet Take by mouth.    [provider]  lisinopril-hydrochlorothiazide (PRINZIDE,ZESTORETIC) 20-25 MG tablet Take by mouth. 07/19/16 07/19/17  [provider]  LORazepam (ATIVAN) 1 MG tablet Take by mouth. 09/06/16   [provider]  potassium chloride SA (K-DUR,KLOR-CON) 20 MEQ tablet Take by mouth. 02/28/16 02/27/17  [provider]  Probiotic Product (ALIGN PO) Take by mouth daily.    [provider]  simvastatin (ZOCOR) 20 MG tablet  10/21/16   [provider]  tiotropium (SPIRIVA) 18 MCG inhalation  capsule Place 18 mcg into inhaler and inhale daily.    [provider]    Allergies Codeine; Lipitor [atorvastatin]; Omeprazole; Vicodin [hydrocodone-acetaminophen]; and Zyban [bupropion]  Family History  Problem Relation Age of Onset  . Heart attack Mother   . Cancer Father   . Breast cancer Neg Hx     Social History Social History  Substance Use Topics  . Smoking status: Former Research scientist (life sciences)  . Smokeless tobacco: Never Used  . Alcohol use No    Review  of Systems  Constitutional: No fever/chills Eyes: No visual changes. ENT: No sore throat. Cardiovascular: Denies chest pain. Respiratory: Denies shortness of breath. Gastrointestinal: No abdominal pain.  No nausea, no vomiting.  No diarrhea.  No constipation. Genitourinary: Negative for dysuria. Musculoskeletal: Negative for back pain. Skin: Negative for rash. Neurological: Negative for headaches, focal weakness or numbness.   ____________________________________________   PHYSICAL EXAM:  VITAL SIGNS: ED Triage Vitals  Enc Vitals Group     BP 06/24/17 0947 117/72     Pulse Rate 06/24/17 0947 99     Resp 06/24/17 0947 (!) 28     Temp 06/24/17 0947 98.4 F (36.9 C)     Temp Source 06/24/17 0947 Oral     SpO2 06/24/17 0947 96 %     Weight 06/24/17 0948 150 lb (68 kg)     Height 06/24/17 0948 5\' 3"  (1.6 m)     Head Circumference --      Peak Flow --      Pain Score --      Pain Loc --      Pain Edu? --      Excl. in [place]? --     Constitutional: Alert and oriented. Well appearing and in no acute distress. Eyes: Conjunctivae are normal.  Head: Atraumatic. Nose: No congestion/rhinnorhea. Mouth/Throat: Mucous membranes are moist.  Neck: No stridor.   Cardiovascular: Normal rate, regular rhythm. Grossly normal heart sounds.  Respiratory: Normal respiratory effort.  No retractions. Lungs CTAB. Gastrointestinal: Soft and nontender. No distention.   Multiple external hemorrhoids which are non-engorged. No actively bleeding external hemorrhoids. On digital rectal exam there were no masses palpated in the rectum. There is no fecal impaction. The stool is grossly brown and strongly heme positive on Hemoccult.  Musculoskeletal: No lower extremity tenderness nor edema.  No joint effusions. Neurologic:  Normal speech and language. No gross focal neurologic deficits are appreciated. Skin:  Skin is warm, dry and intact. No rash noted. Psychiatric: Mood and affect are normal. Speech  and behavior are normal.  ____________________________________________   LABS (all labs ordered are listed, but only abnormal results are displayed)  Labs Reviewed  COMPREHENSIVE METABOLIC PANEL - Abnormal; Notable for the following:       Result Value   Chloride 99 (*)    Glucose, Bld 141 (*)    BUN 39 (*)    Creatinine, Ser 1.38 (*)    ALT 10 (*)    Total Bilirubin 1.6 (*)    GFR calc non Af Amer 35 (*)    GFR calc Af Amer 40 (*)    All other components within normal limits  CBC  POC OCCULT BLOOD, ED  TYPE AND SCREEN   ____________________________________________  EKG   ____________________________________________  RADIOLOGY   ____________________________________________   PROCEDURES  Procedure(s) performed:   Procedures  Critical Care performed:   ____________________________________________   INITIAL IMPRESSION / ASSESSMENT AND PLAN / ED COURSE  Pertinent labs & imaging results that  were available during my care of the patient were reviewed by me and considered in my medical decision making (see chart for details).  Patient with likely hemorrhoidal bleed. Very reassuring vital signs as well as a normal hemoglobin level. The patient only takes a daily aspirin. I recommended the patient continue with her Preparation H. I'll also prescribe her Metamucil for home use and recommended surgical follow-up for possible procedure such as banding for her ongoing hemorrhoid issues. She is understanding willing to comply. We also discussed return precautions is any worsening or concerning symptoms especially black or maroon stools.      ____________________________________________   FINAL CLINICAL IMPRESSION(S) / ED DIAGNOSES  Hemorrhoids. GI bleeding bleeding.    NEW MEDICATIONS STARTED DURING THIS VISIT:  New Prescriptions   No medications on file     Note:  This document was prepared using Dragon voice recognition software and may include  unintentional dictation errors.     Kristen Pyo, MD 06/24/17 1105

## 2017-06-24 NOTE — ED Triage Notes (Signed)
States yesterday she had 3 episodes of bright red rectal bleeding, denies any use of blood thinners, states hx of IBS and hemmorids, states she was scheduled for a chest CT scan today by her PCP, pt on home o2

## 2017-12-17 ENCOUNTER — Other Ambulatory Visit: Payer: Self-pay | Admitting: Internal Medicine

## 2017-12-17 DIAGNOSIS — Z1231 Encounter for screening mammogram for malignant neoplasm of breast: Secondary | ICD-10-CM

## 2018-01-08 ENCOUNTER — Ambulatory Visit
Admission: RE | Admit: 2018-01-08 | Discharge: 2018-01-08 | Disposition: A | Discharge status: Home / Self Care | Payer: Medicare Other | Source: Ambulatory Visit | Attending: Internal Medicine | Admitting: Internal Medicine

## 2018-01-08 DIAGNOSIS — Z1231 Encounter for screening mammogram for malignant neoplasm of breast: Secondary | ICD-10-CM

## 2018-12-29 ENCOUNTER — Other Ambulatory Visit: Payer: Self-pay | Admitting: Family

## 2018-12-29 DIAGNOSIS — Z1231 Encounter for screening mammogram for malignant neoplasm of breast: Secondary | ICD-10-CM

## 2019-01-28 ENCOUNTER — Ambulatory Visit
Admission: RE | Admit: 2019-01-28 | Discharge: 2019-01-28 | Disposition: A | Discharge status: Home / Self Care | Payer: Medicare Other | Source: Ambulatory Visit | Attending: Family | Admitting: Family

## 2019-01-28 DIAGNOSIS — Z1231 Encounter for screening mammogram for malignant neoplasm of breast: Secondary | ICD-10-CM | POA: Insufficient documentation

## 2019-03-30 ENCOUNTER — Telehealth: Payer: Self-pay

## 2019-03-30 NOTE — Telephone Encounter (Signed)
Copied from [place] [id number]. Topic: Appointment Scheduling - Prior Auth Required for Appointment >> Mar 30, 2019  3:27 PM Robina Ade, Helene Kelp D wrote: No appointment has been scheduled. Patient is requesting New Patient appointment. Per scheduling protocol, this appointment requires a prior authorization prior to scheduling.  Route to department's PEC pool. Patient was a patient of provider Aundra Dubin at [facility].

## 2019-03-30 NOTE — Telephone Encounter (Signed)
Copied from [place] [id number]. Topic: Appointment Scheduling - Prior Auth Required for Appointment >> Mar 30, 2019  3:27 PM Robina Ade, Helene Kelp D wrote: No appointment has been scheduled. Patient is requesting New Patient appointment. Per scheduling protocol, this appointment requires a prior authorization prior to scheduling.  Route to department's PEC pool. Patient was a patient of provider Aundra Dubin at [facility].

## 2019-04-01 NOTE — Telephone Encounter (Signed)
I think that the [facility] were told not to schedule appt but forward it to the clinic to be scheduled for new patients.

## 2019-04-01 NOTE — Telephone Encounter (Signed)
Ok. Pt has been scheduled in August. Thank you! And NP paper work was sent out.

## 2019-04-01 NOTE — Telephone Encounter (Signed)
Prior Auth ? For NP appt? Let me know. Thank you!

## 2019-05-05 ENCOUNTER — Encounter: Payer: Self-pay | Admitting: Emergency Medicine

## 2019-05-05 ENCOUNTER — Emergency Department: Payer: Medicare Other

## 2019-05-05 ENCOUNTER — Emergency Department
Admission: EM | Admit: 2019-05-05 | Discharge: 2019-05-05 | Disposition: A | Discharge status: Home / Self Care | Payer: Medicare Other | Attending: Emergency Medicine | Admitting: Emergency Medicine

## 2019-05-05 DIAGNOSIS — R0602 Shortness of breath: Secondary | ICD-10-CM | POA: Diagnosis not present

## 2019-05-05 DIAGNOSIS — Z7982 Long term (current) use of aspirin: Secondary | ICD-10-CM | POA: Diagnosis not present

## 2019-05-05 DIAGNOSIS — Z87891 Personal history of nicotine dependence: Secondary | ICD-10-CM | POA: Insufficient documentation

## 2019-05-05 DIAGNOSIS — F419 Anxiety disorder, unspecified: Secondary | ICD-10-CM | POA: Diagnosis present

## 2019-05-05 DIAGNOSIS — J449 Chronic obstructive pulmonary disease, unspecified: Secondary | ICD-10-CM | POA: Diagnosis not present

## 2019-05-05 DIAGNOSIS — I10 Essential (primary) hypertension: Secondary | ICD-10-CM | POA: Insufficient documentation

## 2019-05-05 DIAGNOSIS — Z79899 Other long term (current) drug therapy: Secondary | ICD-10-CM | POA: Diagnosis not present

## 2019-05-05 LAB — CBC WITH DIFFERENTIAL/PLATELET
Abs Immature Granulocytes: 0.03 10*3/uL (ref 0.00–0.07)
Basophils Absolute: 0 10*3/uL (ref 0.0–0.1)
Basophils Relative: 1 %
Eosinophils Absolute: 0.1 10*3/uL (ref 0.0–0.5)
Eosinophils Relative: 1 %
HCT: 38.2 % (ref 36.0–46.0)
Hemoglobin: 12.1 g/dL (ref 12.0–15.0)
Immature Granulocytes: 0 %
Lymphocytes Relative: 20 %
Lymphs Abs: 1.3 10*3/uL (ref 0.7–4.0)
MCH: 29.7 pg (ref 26.0–34.0)
MCHC: 31.7 g/dL (ref 30.0–36.0)
MCV: 93.6 fL (ref 80.0–100.0)
Monocytes Absolute: 0.5 10*3/uL (ref 0.1–1.0)
Monocytes Relative: 8 %
Neutro Abs: 4.8 10*3/uL (ref 1.7–7.7)
Neutrophils Relative %: 70 %
Platelets: 222 10*3/uL (ref 150–400)
RBC: 4.08 MIL/uL (ref 3.87–5.11)
RDW: 12.5 % (ref 11.5–15.5)
WBC: 6.8 10*3/uL (ref 4.0–10.5)
nRBC: 0 % (ref 0.0–0.2)

## 2019-05-05 LAB — TROPONIN I: Troponin I: <0.03 ng/mL (ref <0.03)

## 2019-05-05 LAB — BASIC METABOLIC PANEL
Anion gap: 9 (ref 5–15)
BUN: 25 mg/dL — ABNORMAL HIGH (ref 8–23)
CO2: 28 mmol/L (ref 22–32)
Calcium: 8.9 mg/dL (ref 8.9–10.3)
Chloride: 98 mmol/L (ref 98–111)
Creatinine, Ser: 0.66 mg/dL (ref 0.44–1.00)
GFR calc Af Amer: >60 mL/min (ref >60)
GFR calc non Af Amer: >60 mL/min (ref >60)
Glucose, Bld: 103 mg/dL — ABNORMAL HIGH (ref 70–99)
Potassium: 3.5 mmol/L (ref 3.5–5.1)
Sodium: 135 mmol/L (ref 135–145)

## 2019-05-05 LAB — BRAIN NATRIURETIC PEPTIDE: B Natriuretic Peptide: 71 pg/mL (ref 0.0–100.0)

## 2019-05-05 NOTE — Discharge Instructions (Addendum)
Go back to taking your normal medications and call Dr. Raul Del for further assistance.  Return to the emergency room for any new or worrisome symptoms

## 2019-05-05 NOTE — ED Provider Notes (Signed)
[facility] Emergency Department Provider Note  ____________________________________________   I have reviewed the triage vital signs and the nursing notes. Where available I have reviewed prior notes and, if possible and indicated, outside hospital notes.    HISTORY  Chief Complaint Medication Reaction    HPI Kristen Bailey is a 85 y.o. female patient seen and evaluated during the coronavirus epidemic during a time with low staffing Who presents today complaining of anxiety.  Patient does have a history of anxiety.  She states that her doctor tried to change her medication, and she was worried it would not work and became very anxious.  She then took her albuterol and Spiriva and she felt better.  She is not having any chest pain shortness of breath nausea vomiting and she feels fine except for the room is cold at this time.  She saw her doctor yesterday.  She has no symptoms of cough fever or infection.  She states that she became very anxious and that she feels much better.    Past Medical History:  Diagnosis Date  . Anxiety   . COPD (chronic obstructive pulmonary disease) ([place])   . Essential tremor   . Hypertension   . Hypomagnesemia     Patient Active Problem List   Diagnosis Date Noted  . PAD (peripheral artery disease) ([place]) 11/22/2016  . Pain in limb 11/22/2016  . COPD (chronic obstructive pulmonary disease) ([place]) 11/22/2016  . Hyperlipidemia 11/22/2016  . Essential hypertension 11/22/2016    Past Surgical History:  Procedure Laterality Date  . ABDOMINAL HYSTERECTOMY    . CHOLECYSTECTOMY    . OVARIAN CYST REMOVAL      Prior to Admission medications   Medication Sig Start Date End Date Taking? Authorizing Provider  acetaminophen (TYLENOL) 500 MG tablet Take 500 mg by mouth every 6 (six) hours as needed.    [provider]  amLODipine (NORVASC) 5 MG tablet Take 5 mg by mouth daily.    [provider]  aspirin EC  81 MG tablet Take by mouth.    [provider]  budesonide-formoterol (SYMBICORT) 160-4.5 MCG/ACT inhaler Inhale 2 puffs into the lungs 2 (two) times daily.    [provider]  calcium carbonate (OSCAL) 1500 (600 Ca) MG TABS tablet Take by mouth 2 (two) times daily with a meal.    [provider]  Cholecalciferol (VITAMIN D-1000 MAX ST) 1000 units tablet Take by mouth.    [provider]  lisinopril-hydrochlorothiazide (PRINZIDE,ZESTORETIC) 20-25 MG tablet Take by mouth. 07/19/16 07/19/17  [provider]  LORazepam (ATIVAN) 1 MG tablet Take 1 mg by mouth 2 (two) times daily.  09/06/16   [provider]  potassium chloride SA (K-DUR,KLOR-CON) 20 MEQ tablet Take by mouth. 02/28/16 06/24/17  [provider]  Probiotic Product (ALIGN PO) Take by mouth daily.    [provider]  tiotropium (SPIRIVA) 18 MCG inhalation capsule Place 18 mcg into inhaler and inhale daily.    [provider]    Allergies Codeine; Lipitor [atorvastatin]; Omeprazole; Vicodin [hydrocodone-acetaminophen]; and Zyban [bupropion]  Family History  Problem Relation Age of Onset  . Heart attack Mother   . Cancer Father   . Breast cancer Neg Hx     Social History Social History   Tobacco Use  . Smoking status: Former Research scientist (life sciences)  . Smokeless tobacco: Never Used  Substance Use Topics  . Alcohol use: No  . Drug use: No    Review of Systems Constitutional:  No fever/chills Eyes: No visual changes. ENT: No sore throat. No stiff neck no neck pain Cardiovascular: Denies chest pain. Respiratory: Denies shortness of breath. Gastrointestinal:   no vomiting.  No diarrhea.  No constipation. Genitourinary: Negative for dysuria. Musculoskeletal: Negative lower extremity swelling Skin: Negative for rash. Neurological: Negative for severe headaches, focal weakness or numbness.   ____________________________________________   PHYSICAL EXAM:  VITAL  SIGNS: ED Triage Vitals  Enc Vitals Group     BP 05/05/19 0902 137/79     Pulse Rate 05/05/19 0902 72     Resp 05/05/19 0902 20     Temp 05/05/19 0902 98.6 F (37 C)     Temp Source 05/05/19 0902 Oral     SpO2 05/05/19 0858 96 %     Weight 05/05/19 0903 143 lb (64.9 kg)     Height 05/05/19 0903 5\' 3"  (1.6 m)     Head Circumference --      Peak Flow --      Pain Score 05/05/19 0903 0     Pain Loc --      Pain Edu? --      Excl. in [place]? --     Constitutional: Alert and oriented. Well appearing and in no acute distress. Eyes: Conjunctivae are normal Head: Atraumatic HEENT: No congestion/rhinnorhea. Mucous membranes are moist.  Oropharynx non-erythematous Neck:   Nontender with no meningismus, no masses, no stridor Cardiovascular: Normal rate, regular rhythm. Grossly normal heart sounds.  Good peripheral circulation. Respiratory: Normal respiratory effort.  No retractions. Lungs CTAB. Abdominal: Soft and nontender. No distention. No guarding no rebound Back:  There is no focal tenderness or step off.  there is no midline tenderness there are no lesions noted. there is no CVA tenderness Musculoskeletal: No lower extremity tenderness, no upper extremity tenderness. No joint effusions, no DVT signs strong distal pulses no edema Neurologic:  Normal speech and language. No gross focal neurologic deficits are appreciated.  Skin:  Skin is warm, dry and intact. No rash noted. Psychiatric: Mood and affect are anxious but seemingly under better control than when she arrived. Speech and behavior are normal.  ____________________________________________   LABS (all labs ordered are listed, but only abnormal results are displayed)  Labs Reviewed  CBC WITH DIFFERENTIAL/PLATELET  TROPONIN I  BRAIN NATRIURETIC PEPTIDE  BASIC METABOLIC PANEL    Pertinent labs  results that were available during my care of the patient were reviewed by me and considered in my medical decision making (see chart  for details). ____________________________________________  EKG  I personally interpreted any EKGs ordered by me or triage Sinus rhythm rate 97 bpm, normal axis no acute ST elevation or depression, nonspecific ST changes ____________________________________________  RADIOLOGY  Pertinent labs & imaging results that were available during my care of the patient were reviewed by me and considered in my medical decision making (see chart for details). If possible, patient and/or family made aware of any abnormal findings.  No results found. ____________________________________________    PROCEDURES  Procedure(s) performed: None  Procedures  Critical Care performed: None  ____________________________________________   INITIAL IMPRESSION / ASSESSMENT AND PLAN / ED COURSE  Pertinent labs & imaging results that were available during my care of the patient were reviewed by me and considered in my medical decision making (see chart for details).  Patient with reports of anxiety no SI no HI, given that part of her anxiety component she states was some degree of shortness of breath, we will obtain basic blood  work and a chest x-ray just to make sure nothing else is going on, there is no indication at this time for coronavirus treatment or inpatient therapy, patient states she had a panic attack with a history of panic attacks which seems reasonable.  However we will do some basic test to make sure nothing else is afoot even her age and her comorbidities.  She does not meet criteria for involuntary commitment, she wants to go home as soon as the blood work is back but she does consent to stay for 1 set of cardiac markers.    ____________________________________________   FINAL CLINICAL IMPRESSION(S) / ED DIAGNOSES  Final diagnoses:  SOB (shortness of breath)      This chart was dictated using voice recognition software.  Despite best efforts to proofread,  errors can occur which can  change meaning.      Schuyler Amor, MD 05/05/19 1056

## 2019-05-05 NOTE — ED Triage Notes (Signed)
Pt to ED by EMS with c/o of feeling nauseous and anxious/shaking after taking new medication given to her by her PCP 5/18. Pt prescribed tiotropium bromide to be taken once/day. Pt took yesterday and hasn't felt well since.

## 2019-05-05 NOTE — ED Notes (Signed)
First nurse note: POA: [phone] Joe Gipson requesting an update.

## 2019-05-05 NOTE — ED Notes (Signed)
Pt's son contacted by this RN to give update. RN informed by son that pt will be picked up by himself or another family member if pt is discharged.

## 2019-05-05 NOTE — ED Notes (Signed)
Pt verbalized understanding of discharge instructions. NAD at this time. 

## 2019-07-29 ENCOUNTER — Other Ambulatory Visit: Payer: Self-pay

## 2019-08-03 ENCOUNTER — Other Ambulatory Visit: Payer: Self-pay

## 2019-08-03 ENCOUNTER — Encounter: Payer: Self-pay | Admitting: Internal Medicine

## 2019-08-03 ENCOUNTER — Ambulatory Visit (INDEPENDENT_AMBULATORY_CARE_PROVIDER_SITE_OTHER): Payer: Medicare Other | Admitting: Internal Medicine

## 2019-08-03 VITALS — BP 120/60 | HR 83 | Temp 98.0°F | Ht 63.0 in | Wt 143.0 lb

## 2019-08-03 DIAGNOSIS — R739 Hyperglycemia, unspecified: Secondary | ICD-10-CM

## 2019-08-03 DIAGNOSIS — K580 Irritable bowel syndrome with diarrhea: Secondary | ICD-10-CM

## 2019-08-03 DIAGNOSIS — Z1329 Encounter for screening for other suspected endocrine disorder: Secondary | ICD-10-CM

## 2019-08-03 DIAGNOSIS — E538 Deficiency of other specified B group vitamins: Secondary | ICD-10-CM

## 2019-08-03 DIAGNOSIS — E785 Hyperlipidemia, unspecified: Secondary | ICD-10-CM

## 2019-08-03 DIAGNOSIS — K649 Unspecified hemorrhoids: Secondary | ICD-10-CM

## 2019-08-03 DIAGNOSIS — E559 Vitamin D deficiency, unspecified: Secondary | ICD-10-CM

## 2019-08-03 DIAGNOSIS — E876 Hypokalemia: Secondary | ICD-10-CM | POA: Diagnosis not present

## 2019-08-03 DIAGNOSIS — R110 Nausea: Secondary | ICD-10-CM

## 2019-08-03 DIAGNOSIS — F419 Anxiety disorder, unspecified: Secondary | ICD-10-CM

## 2019-08-03 DIAGNOSIS — J449 Chronic obstructive pulmonary disease, unspecified: Secondary | ICD-10-CM | POA: Diagnosis not present

## 2019-08-03 DIAGNOSIS — Z1389 Encounter for screening for other disorder: Secondary | ICD-10-CM

## 2019-08-03 DIAGNOSIS — R252 Cramp and spasm: Secondary | ICD-10-CM

## 2019-08-03 DIAGNOSIS — M542 Cervicalgia: Secondary | ICD-10-CM | POA: Diagnosis not present

## 2019-08-03 MED ORDER — POTASSIUM CHLORIDE CRYS ER 20 MEQ PO TBCR: 20.0000 meq | EXTENDED_RELEASE_TABLET | Freq: Every day | ORAL | 3 refills | Status: DC

## 2019-08-03 NOTE — Progress Notes (Signed)
Chief Complaint  Patient presents with  . New Patient (Initial Visit)   Former [facility] w/in the last 3 years  1. C/o posterior neck pain 5/10 2 months ago fell and back of head/neck hit the dresser drawers and has pain with ROM 2. COPD ON 2L O2 continuous symbicort, prn albuterol, and spiriva f/u Dr. Lawrence [place]se[place] did not tolerate trelegy  3. C/o b/l leg cramps up to abdomen at times she is drinking enough water ABI in the past normal she needs refill of her K  4. C/o IBS-D type and intermittent nausea she takes prn Dramamine which helps   Review of Systems  Constitutional: Negative for weight loss.  HENT: Negative for hearing loss.   Eyes: Negative for blurred vision.  Respiratory: Negative for shortness of breath.   Cardiovascular: Negative for chest pain.  Gastrointestinal: Positive for diarrhea and nausea.  Genitourinary: Negative for dysuria.  Musculoskeletal: Positive for neck pain.  Skin: Negative for rash.  Neurological: Negative for headaches.  Psychiatric/Behavioral: Positive for memory loss. Negative for depression.   Past Medical History:  Diagnosis Date  . Anxiety   . Chicken pox   . COPD (chronic obstructive pulmonary disease) ([place])   . Essential tremor   . Hypertension   . Hypomagnesemia    Past Surgical History:  Procedure Laterality Date  . ABDOMINAL HYSTERECTOMY    . APPENDECTOMY    . CHOLECYSTECTOMY    . OVARIAN CYST REMOVAL    . SMALL INTESTINE SURGERY     Family History  Problem Relation Age of Onset  . Heart attack Mother   . Cancer Father   . Breast cancer Neg Hx    Social History   Socioeconomic History  . Marital status: Widowed    Spouse name: Not on file  . Number of children: Not on file  . Years of education: Not on file  . Highest education level: Not on file  Occupational History  . Not on file  Social Needs  . Financial resource strain: Not on file  . Food insecurity    Worry: Not on file    Inability: Not on file  .  Transportation needs    Medical: Not on file    Non-medical: Not on file  Tobacco Use  . Smoking status: Former Research scientist (life sciences)  . Smokeless tobacco: Never Used  Substance and Sexual Activity  . Alcohol use: No  . Drug use: No  . Sexual activity: Not Currently  Lifestyle  . Physical activity    Days per week: Not on file    Minutes per session: Not on file  . Stress: Not on file  Relationships  . Social Herbalist on phone: Not on file    Gets together: Not on file    Attends religious service: Not on file    Active member of club or organization: Not on file    Attends meetings of clubs or organizations: Not on file    Relationship status: Not on file  . Intimate partner violence    Fear of current or ex partner: Not on file    Emotionally abused: Not on file    Physically abused: Not on file    Forced sexual activity: Not on file  Other Topics Concern  . Not on file  Social History Narrative   Has 4 children    Former smoker    Lives in in retirement community near [place] but not white [employer]  Current Meds  Medication Sig  . acetaminophen (TYLENOL) 500 MG tablet Take 500 mg by mouth every 6 (six) hours as needed.  Marland Kitchen amLODipine (NORVASC) 5 MG tablet Take 5 mg by mouth daily.  Marland Kitchen aspirin EC 81 MG tablet Take 81 mg by mouth daily.   . budesonide-formoterol (SYMBICORT) 160-4.5 MCG/ACT inhaler Inhale 2 puffs into the lungs 2 (two) times daily.  . calcium carbonate (OSCAL) 1500 (600 Ca) MG TABS tablet Take 1,500 mg by mouth 2 (two) times daily with a meal.   . Cholecalciferol (VITAMIN D-1000 MAX ST) 1000 units tablet Take 1,000 Units by mouth daily.   Marland Kitchen LORazepam (ATIVAN) 1 MG tablet Take 1 mg by mouth 2 (two) times daily.   . potassium chloride SA (K-DUR) 20 MEQ tablet Take 1 tablet (20 mEq total) by mouth daily.  . Probiotic Product (ALIGN PO) Take by mouth daily.  Marland Kitchen tiotropium (SPIRIVA) 18 MCG inhalation capsule Place 18 mcg into inhaler and inhale daily.   Allergies   Allergen Reactions  . Trelegy Ellipta [Fluticasone-Umeclidin-Vilant]     Shortness of breath  . Codeine Nausea And Vomiting  . Lipitor [Atorvastatin] Nausea And Vomiting  . Omeprazole     unknown  . Vicodin [Hydrocodone-Acetaminophen]     unknown  . Zyban [Bupropion]     unknown   No results found for this or any previous visit (from the past 2160 hour(s)). Objective  Body mass index is 25.33 kg/m. Wt Readings from Last 3 Encounters:  08/03/19 143 lb (64.9 kg)  05/05/19 143 lb (64.9 kg)  06/24/17 150 lb (68 kg)   Temp Readings from Last 3 Encounters:  08/03/19 98 F (36.7 C) (Temporal)  05/05/19 98.6 F (37 C)  06/24/17 98.4 F (36.9 C) (Oral)   BP Readings from Last 3 Encounters:  08/03/19 120/60  05/05/19 (!) 141/71  06/24/17 (!) 114/59   Pulse Readings from Last 3 Encounters:  08/03/19 83  05/05/19 66  06/24/17 70    Physical Exam Vitals signs and nursing note reviewed.  Constitutional:      Appearance: Normal appearance. She is well-developed and well-groomed.  HENT:     Head: Normocephalic and atraumatic.     Comments: +mask on   Eyes:     Conjunctiva/sclera: Conjunctivae normal.     Pupils: Pupils are equal, round, and reactive to light.  Neck:     Musculoskeletal: Muscular tenderness present.     Comments: Pain with ROM neck and ttp lower cervical on exam  Cardiovascular:     Rate and Rhythm: Normal rate and regular rhythm.     Heart sounds: Normal heart sounds.     Comments: No PVCs on exam today   Pulmonary:     Effort: Pulmonary effort is normal.     Breath sounds: Normal breath sounds.     Comments: On 2L continuous O2 today  Skin:    General: Skin is warm and dry.  Neurological:     General: No focal deficit present.     Mental Status: She is alert and oriented to person, place, and time. Mental status is at baseline.     Comments: Walking with cane today but has rollator as well   Psychiatric:        Attention and Perception:  Attention and perception normal.        Mood and Affect: Mood and affect normal.        Speech: Speech normal.  Behavior: Behavior normal. Behavior is cooperative.        Thought Content: Thought content normal.        Cognition and Memory: Cognition and memory normal.        Judgment: Judgment normal.     Assessment   1. Neck pain x/p fall 2 months ago  2. COPD on cont 2L O2 3. Leg cramps to upper abdomen  4. ibs D with nausea intermittently  5. HM Plan   1. CT cervical  2. F/u pulm  3. checK K and mag  4. F/u Gi prn offered prn Zofran declines  Consider US abdomen if continues  5.  Check prior vaccines [facility] (per pt had 10/04/18 pna shot and flu)  Tdap 02/09/16  Out of age window pap, colonoscopy mammo 01/28/19 negative  Consider DEXA if has not had  Former smoker   GI Dr. Tiffany Kocher  Lung Dr. Rosita Kea Dr. Michaelene Song AE  [facility] records   Provider: Dr. Olivia Mackie McLean-Scocuzza-Internal Medicine

## 2019-08-03 NOTE — Patient Instructions (Addendum)
[facility] outpatient imaging  [place]  [phone]  CT cervical neck   Hyalands leg cramp supplement over the counter and Theraworx topical magnesium over the counter    Cervical Sprain  A cervical sprain is a stretch or tear in one or more of the tough, cord-like tissues that connect bones (ligaments) in the neck. Cervical sprains can range from mild to severe. Severe cervical sprains can cause the spinal bones (vertebrae) in the neck to be unstable. This can lead to spinal cord damage and can result in serious nervous system problems. The amount of time that it takes for a cervical sprain to get better depends on the cause and extent of the injury. Most cervical sprains heal in 4-6 weeks. What are the causes? Cervical sprains may be caused by an injury (trauma), such as from a motor vehicle accident, a fall, or sudden forward and backward whipping movement of the head and neck (whiplash injury). Mild cervical sprains may be caused by wear and tear over time, such as from poor posture, sitting in a chair that does not provide support, or looking up or down for [place] periods of time. What increases the risk? The following factors may make you more likely to develop this condition:  Participating in activities that have a high risk of trauma to the neck. These include contact sports, auto racing, gymnastics, and diving.  Taking risks when driving or riding in a motor vehicle, such as speeding.  Having osteoarthritis of the spine.  Having poor strength and flexibility of the neck.  A previous neck injury.  Having poor posture.  Spending a lot of time in certain positions that put stress on the neck, such as sitting at a computer for [place] periods of time. What are the signs or symptoms? Symptoms of this condition include:  Pain, soreness, stiffness, tenderness, swelling, or a burning sensation in the front, back, or sides of the neck.  Sudden  tightening of neck muscles that you cannot control (muscle spasms).  Pain in the shoulders or upper back.  Limited ability to move the neck.  Headache.  Dizziness.  Nausea.  Vomiting.  Weakness, numbness, or tingling in a hand or an arm. Symptoms may develop right away after injury, or they may develop over a few days. In some cases, symptoms may go away with treatment and return (recur) over time. How is this diagnosed? This condition may be diagnosed based on:  Your medical history.  Your symptoms.  Any recent injuries or known neck problems that you have, such as arthritis in the neck.  A physical exam.  Imaging tests, such as: ? X-rays. ? MRI. ? CT scan. How is this treated? This condition is treated by resting and icing the injured area and doing physical therapy exercises. Depending on the severity of your condition, treatment may also include:  Keeping your neck in place (immobilized) for periods of time. This may be done using: ? A cervical collar. This supports your chin and the back of your head. ? A cervical traction device. This is a sling that holds up your head. This removes weight and pressure from your neck, and it may help to relieve pain.  Medicines that help to relieve pain and inflammation.  Medicines that help to relax your muscles (muscle relaxants).  Surgery. This is rare. Follow these instructions at home: If you have a cervical collar:   Wear it as told by your health care provider.  Do not remove the collar unless instructed by your health care provider.  Ask your health care provider before you make any adjustments to your collar.  If you have [place] hair, keep it outside of the collar.  Ask your health care provider if you can remove the collar for cleaning and bathing. If you are allowed to remove the collar for cleaning or bathing: ? Follow instructions from your health care provider about how to remove the collar safely. ? Clean the  collar by wiping it with mild soap and water and drying it completely. ? If your collar has removable pads, remove them every 1-2 days and wash them by hand with soap and water. Let them air-dry completely before you put them back in the collar. ? Check your skin under the collar for irritation or sores. If you see any, tell your health care provider. Managing pain, stiffness, and swelling   If directed, use a cervical traction device as told by your health care provider.  If directed, apply heat to the affected area before you do your physical therapy or as often as told by your health care provider. Use the heat source that your health care provider recommends, such as a moist heat pack or a heating pad. ? Place a towel between your skin and the heat source. ? Leave the heat on for 20-30 minutes. ? Remove the heat if your skin turns bright red. This is especially important if you are unable to feel pain, heat, or cold. You may have a greater risk of getting burned.  If directed, put ice on the affected area: ? Put ice in a plastic bag. ? Place a towel between your skin and the bag. ? Leave the ice on for 20 minutes, 2-3 times a day. Activity  Do not drive while wearing a cervical collar. If you do not have a cervical collar, ask your health care provider if it is safe to drive while your neck heals.  Do not drive or use heavy machinery while taking prescription pain medicine or muscle relaxants, unless your health care provider approves.  Do not lift anything that is heavier than 10 lb (4.5 kg) until your health care provider tells you that it is safe.  Rest as directed by your health care provider. Avoid positions and activities that make your symptoms worse. Ask your health care provider what activities are safe for you.  If physical therapy was prescribed, do exercises as told by your health care provider or physical therapist. General instructions  Take over-the-counter and  prescription medicines only as told by your health care provider.  Do not use any products that contain nicotine or tobacco, such as cigarettes and e-cigarettes. These can delay healing. If you need help quitting, ask your health care provider.  Keep all follow-up visits as told by your health care provider or physical therapist. This is important. How is this prevented? To prevent a cervical sprain from happening again:  Use and maintain good posture. Make any needed adjustments to your workstation to help you use good posture.  Exercise regularly as directed by your health care provider or physical therapist.  Avoid risky activities that may cause a cervical sprain. Contact a health care provider if:  You have symptoms that get worse or do not get better after 2 weeks of treatment.  You have pain that gets worse or does not get better with medicine.  You develop new, unexplained symptoms.  You have sores or  irritated skin on your neck from wearing your cervical collar. Get help right away if:  You have severe pain.  You develop numbness, tingling, or weakness in any part of your body.  You cannot move a part of your body (you have paralysis).  You have neck pain along with: ? Severe dizziness. ? Headache. Summary  A cervical sprain is a stretch or tear in one or more of the tough, cord-like tissues that connect bones (ligaments) in the neck.  Cervical sprains may be caused by an injury (trauma), such as from a motor vehicle accident, a fall, or sudden forward and backward whipping movement of the head and neck (whiplash injury).  Symptoms may develop right away after injury, or they may develop over a few days.  This condition is treated by resting and icing the injured area and doing physical therapy exercises. This information is not intended to replace advice given to you by your health care provider. Make sure you discuss any questions you have with your health care  provider. Document Released: 09/29/2007 Document Revised: 03/24/2019 Document Reviewed: 07/31/2016 Elsevier Patient Education  2020 [employer].

## 2019-08-04 ENCOUNTER — Encounter: Payer: Self-pay | Admitting: Internal Medicine

## 2019-08-04 DIAGNOSIS — R110 Nausea: Secondary | ICD-10-CM | POA: Insufficient documentation

## 2019-08-04 DIAGNOSIS — K649 Unspecified hemorrhoids: Secondary | ICD-10-CM | POA: Insufficient documentation

## 2019-08-04 DIAGNOSIS — E876 Hypokalemia: Secondary | ICD-10-CM | POA: Insufficient documentation

## 2019-08-04 DIAGNOSIS — K589 Irritable bowel syndrome without diarrhea: Secondary | ICD-10-CM | POA: Insufficient documentation

## 2019-08-04 DIAGNOSIS — F419 Anxiety disorder, unspecified: Secondary | ICD-10-CM | POA: Insufficient documentation

## 2019-08-04 DIAGNOSIS — R252 Cramp and spasm: Secondary | ICD-10-CM | POA: Insufficient documentation

## 2019-08-11 ENCOUNTER — Ambulatory Visit
Admission: RE | Admit: 2019-08-11 | Discharge: 2019-08-11 | Disposition: A | Discharge status: Home / Self Care | Payer: Medicare Other | Source: Ambulatory Visit | Attending: Internal Medicine | Admitting: Internal Medicine

## 2019-08-11 ENCOUNTER — Other Ambulatory Visit: Payer: Self-pay

## 2019-08-11 DIAGNOSIS — M542 Cervicalgia: Secondary | ICD-10-CM | POA: Diagnosis present

## 2019-08-12 ENCOUNTER — Other Ambulatory Visit: Payer: Self-pay | Admitting: Internal Medicine

## 2019-08-12 DIAGNOSIS — M542 Cervicalgia: Secondary | ICD-10-CM

## 2019-08-17 ENCOUNTER — Telehealth: Payer: Self-pay | Admitting: Internal Medicine

## 2019-08-17 ENCOUNTER — Other Ambulatory Visit: Payer: Self-pay

## 2019-08-17 ENCOUNTER — Other Ambulatory Visit (INDEPENDENT_AMBULATORY_CARE_PROVIDER_SITE_OTHER): Payer: Medicare Other

## 2019-08-17 DIAGNOSIS — Z1329 Encounter for screening for other suspected endocrine disorder: Secondary | ICD-10-CM

## 2019-08-17 DIAGNOSIS — R739 Hyperglycemia, unspecified: Secondary | ICD-10-CM | POA: Diagnosis not present

## 2019-08-17 DIAGNOSIS — Z1389 Encounter for screening for other disorder: Secondary | ICD-10-CM

## 2019-08-17 DIAGNOSIS — E559 Vitamin D deficiency, unspecified: Secondary | ICD-10-CM | POA: Diagnosis not present

## 2019-08-17 DIAGNOSIS — E538 Deficiency of other specified B group vitamins: Secondary | ICD-10-CM | POA: Diagnosis not present

## 2019-08-17 DIAGNOSIS — E785 Hyperlipidemia, unspecified: Secondary | ICD-10-CM | POA: Diagnosis not present

## 2019-08-17 DIAGNOSIS — R252 Cramp and spasm: Secondary | ICD-10-CM

## 2019-08-17 DIAGNOSIS — E876 Hypokalemia: Secondary | ICD-10-CM | POA: Diagnosis not present

## 2019-08-17 LAB — LIPID PANEL
Cholesterol: 229 mg/dL — ABNORMAL HIGH (ref 0–200)
HDL: 95.9 mg/dL (ref >39.00)
LDL Cholesterol: 114 mg/dL — ABNORMAL HIGH (ref 0–99)
NonHDL: 133.34
Total CHOL/HDL Ratio: 2
Triglycerides: 96 mg/dL (ref 0.0–149.0)
VLDL: 19.2 mg/dL (ref 0.0–40.0)

## 2019-08-17 LAB — COMPREHENSIVE METABOLIC PANEL
ALT: 7 U/L (ref 0–35)
AST: 13 U/L (ref 0–37)
Albumin: 4.1 g/dL (ref 3.5–5.2)
Alkaline Phosphatase: 50 U/L (ref 39–117)
BUN: 31 mg/dL — ABNORMAL HIGH (ref 6–23)
CO2: 33 mEq/L — ABNORMAL HIGH (ref 19–32)
Calcium: 9.5 mg/dL (ref 8.4–10.5)
Chloride: 101 mEq/L (ref 96–112)
Creatinine, Ser: 0.95 mg/dL (ref 0.40–1.20)
GFR: 56.08 mL/min — ABNORMAL LOW (ref >60.00)
Glucose, Bld: 102 mg/dL — ABNORMAL HIGH (ref 70–99)
Potassium: 3.8 mEq/L (ref 3.5–5.1)
Sodium: 143 mEq/L (ref 135–145)
Total Bilirubin: 1.1 mg/dL (ref 0.2–1.2)
Total Protein: 6.2 g/dL (ref 6.0–8.3)

## 2019-08-17 LAB — VITAMIN D 25 HYDROXY (VIT D DEFICIENCY, FRACTURES): VITD: 46.86 ng/mL (ref 30.00–100.00)

## 2019-08-17 LAB — HEMOGLOBIN A1C: Hgb A1c MFr Bld: 5.6 % (ref 4.6–6.5)

## 2019-08-17 LAB — VITAMIN B12: Vitamin B-12: 1211 pg/mL — ABNORMAL HIGH (ref 211–911)

## 2019-08-17 LAB — MAGNESIUM: Magnesium: 1.3 mg/dL — ABNORMAL LOW (ref 1.5–2.5)

## 2019-08-17 LAB — TSH: TSH: 1.21 u[IU]/mL (ref 0.35–4.50)

## 2019-08-17 NOTE — Addendum Note (Signed)
Addended by: Elpidio Galea T on: 08/17/2019 09:16 AM   Modules accepted: Orders

## 2019-08-17 NOTE — Telephone Encounter (Signed)
Paperwork put on providers desk.

## 2019-08-17 NOTE — Telephone Encounter (Signed)
Pt dropped of Disability parking placard form to be filled out. Placed in Dr. Audrie Gallus color folder up front  Please mail to pt when completed

## 2019-08-18 ENCOUNTER — Other Ambulatory Visit: Payer: Self-pay | Admitting: Internal Medicine

## 2019-08-18 DIAGNOSIS — E785 Hyperlipidemia, unspecified: Secondary | ICD-10-CM

## 2019-08-18 MED ORDER — PRAVASTATIN SODIUM 10 MG PO TABS: 10.0000 mg | ORAL_TABLET | Freq: Every day | ORAL | 3 refills | Status: DC

## 2019-08-20 ENCOUNTER — Telehealth: Payer: Self-pay | Admitting: *Deleted

## 2019-08-20 NOTE — Telephone Encounter (Signed)
Copied from [place] [id number]. Topic: General - Other >> Aug 20, 2019 12:10 PM [place], [place] D wrote: Reason for CRM: Pt stated she was supposed to drop off a urine sample today, however she is unable to so she will bring it Tuesday 08/24/19

## 2019-08-24 ENCOUNTER — Other Ambulatory Visit: Payer: Medicare Other

## 2019-08-24 ENCOUNTER — Other Ambulatory Visit: Payer: Self-pay

## 2019-08-24 DIAGNOSIS — R739 Hyperglycemia, unspecified: Secondary | ICD-10-CM

## 2019-08-24 DIAGNOSIS — E876 Hypokalemia: Secondary | ICD-10-CM

## 2019-08-25 LAB — URINALYSIS, ROUTINE W REFLEX MICROSCOPIC
Bilirubin Urine: NEGATIVE
Glucose, UA: NEGATIVE
Hgb urine dipstick: NEGATIVE
Ketones, ur: NEGATIVE
Leukocytes,Ua: NEGATIVE
Nitrite: NEGATIVE
Protein, ur: NEGATIVE
Specific Gravity, Urine: 1.014 (ref 1.001–1.03)
pH: 6.5 (ref 5.0–8.0)

## 2019-10-13 ENCOUNTER — Other Ambulatory Visit: Payer: Self-pay

## 2019-10-13 ENCOUNTER — Ambulatory Visit (INDEPENDENT_AMBULATORY_CARE_PROVIDER_SITE_OTHER): Payer: Medicare Other | Admitting: Internal Medicine

## 2019-10-13 ENCOUNTER — Encounter: Payer: Self-pay | Admitting: Internal Medicine

## 2019-10-13 VITALS — Ht 63.0 in | Wt 144.0 lb

## 2019-10-13 DIAGNOSIS — M1990 Unspecified osteoarthritis, unspecified site: Secondary | ICD-10-CM | POA: Diagnosis not present

## 2019-10-13 DIAGNOSIS — R1084 Generalized abdominal pain: Secondary | ICD-10-CM | POA: Diagnosis not present

## 2019-10-13 DIAGNOSIS — R197 Diarrhea, unspecified: Secondary | ICD-10-CM | POA: Diagnosis not present

## 2019-10-13 DIAGNOSIS — M542 Cervicalgia: Secondary | ICD-10-CM

## 2019-10-13 DIAGNOSIS — R110 Nausea: Secondary | ICD-10-CM | POA: Diagnosis not present

## 2019-10-13 MED ORDER — DICLOFENAC SODIUM 1 % TD GEL: 2.0000 g | Freq: Four times a day (QID) | TRANSDERMAL | 11 refills | Status: DC

## 2019-10-13 MED ORDER — ONDANSETRON HCL 4 MG PO TABS: 4.0000 mg | ORAL_TABLET | Freq: Three times a day (TID) | ORAL | 0 refills | Status: DC | PRN

## 2019-10-13 NOTE — Progress Notes (Signed)
Telephone Note  I connected with Kristen Bailey  on 10/13/19 at  2:00 PM EDT by a telephone and verified that I am speaking with the correct person using two identifiers.  Location patient: home Location provider:work or home office Persons participating in the virtual visit: patient, provider  I discussed the limitations of evaluation and management by telemedicine and the availability of in person appointments. The patient expressed understanding and agreed to proceed.   HPI: 1. COPD severe on O2 2L continuous and has sob with exertion saw Dr. Raul Del 10/06/19 no changes rec can increased O2 to 3-4 L with exertion but disc with pt can go up to 3L 1st her O2 today is 96% on 2L  No med changes were made 2. >1 month c/o nausea x 2-3 x per week abdominal pain upper near her ribs and generalized intermittently with diarrhea tried immodium for diarrhea and dramamine for nausea pain at times 5/10 3. Chronic neck pain wants refill of voltaren gel sent to pharmacy   ROS: See pertinent positives and negatives per HPI.  Past Medical History:  Diagnosis Date  . Anxiety   . Chicken pox   . COPD (chronic obstructive pulmonary disease) ([place])   . Essential tremor   . Hypertension   . Hypomagnesemia     Past Surgical History:  Procedure Laterality Date  . ABDOMINAL HYSTERECTOMY    . APPENDECTOMY    . CHOLECYSTECTOMY    . OVARIAN CYST REMOVAL    . SMALL INTESTINE SURGERY      Family History  Problem Relation Age of Onset  . Heart attack Mother   . Heart disease Mother   . Hypertension Mother   . Cancer Father        bladder smoker  . Heart disease Brother        MI in 82s   . Breast cancer Neg Hx     SOCIAL HX:  Has 4 children  Former smoker  [place]ives in in retirement community near [place]    Current Outpatient Medications:  .  acetaminophen (TYLENOL) 500 MG tablet, Take 500 mg by mouth every 6 (six) hours as needed., Disp: , Rfl:  .  amLODipine (NORVASC) 5  MG tablet, Take 5 mg by mouth daily., Disp: , Rfl:  .  aspirin EC 81 MG tablet, Take 81 mg by mouth daily. , Disp: , Rfl:  .  budesonide-formoterol (SYMBICORT) 160-4.5 MCG/ACT inhaler, Inhale 2 puffs into the lungs 2 (two) times daily., Disp: , Rfl:  .  calcium carbonate (OSCAL) 1500 (600 Ca) MG TABS tablet, Take 1,500 mg by mouth 2 (two) times daily with a meal. , Disp: , Rfl:  .  Cholecalciferol (VITAMIN D-1000 MAX ST) 1000 units tablet, Take 1,000 Units by mouth daily. , Disp: , Rfl:  .  LORazepam (ATIVAN) 1 MG tablet, Take 1 mg by mouth 2 (two) times daily. , Disp: , Rfl:  .  potassium chloride SA (K-DUR) 20 MEQ tablet, Take 1 tablet (20 mEq total) by mouth daily., Disp: 90 tablet, Rfl: 3 .  Probiotic Product (ALIGN PO), Take by mouth daily., Disp: , Rfl:  .  tiotropium (SPIRIVA) 18 MCG inhalation capsule, Place 18 mcg into inhaler and inhale daily., Disp: , Rfl:  .  diclofenac sodium (VOLTAREN) 1 % GEL, Apply 2-4 g topically 4 (four) times daily. Prn 2 g upper body and 4 g lower body, Disp: 100 g, Rfl: 11 .  lisinopril-hydrochlorothiazide (PRINZIDE,ZESTORETIC) 20-25 MG  tablet, Take 1 tablet by mouth daily. , Disp: , Rfl:  .  ondansetron (ZOFRAN) 4 MG tablet, Take 1 tablet (4 mg total) by mouth every 8 (eight) hours as needed for nausea or vomiting., Disp: 90 tablet, Rfl: 0 .  pravastatin (PRAVACHOL) 10 MG tablet, Take 1 tablet (10 mg total) by mouth daily. At night (Patient not taking: Reported on 10/13/2019), Disp: 90 tablet, Rfl: 3  EXAM:  VITALS per patient if applicable:  GENERAL: alert, oriented, appears well and in no acute distress  PSYCH/NEURO: pleasant and cooperative, no obvious depression or anxiety, speech and thought processing grossly intact  ASSESSMENT AND PLAN:  Discussed the following assessment and plan:  Nausea - Plan: CT Abdomen Pelvis Wo Contrast, ondansetron (ZOFRAN) 4 MG tablet tid prn Generalized abdominal pain - Plan: CT Abdomen Pelvis Wo Contrast Diarrhea,  unspecified type - Plan: CT Abdomen Pelvis Wo Contrast  Arthritis - Plan: diclofenac sodium (VOLTAREN) 1 % GEL  Cervicalgia - Plan: diclofenac sodium (VOLTAREN) 1 % GEL   HM Flu 10/06/19 with pulm clinic Dr. Raul Del Check prior vaccines [facility] (per pt had 10/04/18 pna shot and flu)  Tdap 02/09/16  Out of age window pap, colonoscopy mammo 01/28/19 negative  Consider DEXA if has not had  Former smoker   GI Dr. Tiffany Kocher  Lung Dr. Rosita Kea Dr. Michaelene Song [facility]  [facility] records   -we discussed possible serious and likely etiologies, options for evaluation and workup, limitations of telemedicine visit vs in person visit, treatment, treatment risks and precautions. Pt prefers to treat via telemedicine empirically rather then risking or undertaking an in person visit at this moment. Patient agrees to seek prompt in person care if worsening, new symptoms arise, or if is not improving with treatment.   I discussed the assessment and treatment plan with the patient. The patient was provided an opportunity to ask questions and all were answered. The patient agreed with the plan and demonstrated an understanding of the instructions.   The patient was advised to call back or seek an in-person evaluation if the symptoms worsen or if the condition fails to improve as anticipated.  Time spent 20 minutes  Delorise Jackson, MD

## 2019-10-22 ENCOUNTER — Ambulatory Visit: Admission: RE | Admit: 2019-10-22 | Payer: Medicare Other | Source: Ambulatory Visit

## 2019-10-25 DIAGNOSIS — J449 Chronic obstructive pulmonary disease, unspecified: Secondary | ICD-10-CM | POA: Diagnosis not present

## 2019-10-28 ENCOUNTER — Ambulatory Visit
Admission: RE | Admit: 2019-10-28 | Discharge: 2019-10-28 | Disposition: A | Discharge status: Home / Self Care | Payer: Medicare Other | Source: Ambulatory Visit | Attending: Internal Medicine | Admitting: Internal Medicine

## 2019-10-28 ENCOUNTER — Other Ambulatory Visit: Payer: Self-pay

## 2019-10-28 DIAGNOSIS — R197 Diarrhea, unspecified: Secondary | ICD-10-CM | POA: Diagnosis not present

## 2019-10-28 DIAGNOSIS — R109 Unspecified abdominal pain: Secondary | ICD-10-CM | POA: Diagnosis not present

## 2019-10-28 DIAGNOSIS — R1084 Generalized abdominal pain: Secondary | ICD-10-CM | POA: Diagnosis not present

## 2019-10-28 DIAGNOSIS — R110 Nausea: Secondary | ICD-10-CM | POA: Diagnosis not present

## 2019-10-30 ENCOUNTER — Other Ambulatory Visit: Payer: Self-pay | Admitting: Internal Medicine

## 2019-10-30 DIAGNOSIS — K529 Noninfective gastroenteritis and colitis, unspecified: Secondary | ICD-10-CM

## 2019-10-30 DIAGNOSIS — R112 Nausea with vomiting, unspecified: Secondary | ICD-10-CM

## 2019-11-01 ENCOUNTER — Encounter: Payer: Self-pay | Admitting: *Deleted

## 2019-11-24 DIAGNOSIS — J449 Chronic obstructive pulmonary disease, unspecified: Secondary | ICD-10-CM | POA: Diagnosis not present

## 2019-11-30 ENCOUNTER — Other Ambulatory Visit: Payer: Self-pay | Admitting: Internal Medicine

## 2019-11-30 DIAGNOSIS — I10 Essential (primary) hypertension: Secondary | ICD-10-CM

## 2019-11-30 MED ORDER — AMLODIPINE BESYLATE 5 MG PO TABS: 5.0000 mg | ORAL_TABLET | Freq: Every day | ORAL | 3 refills | Status: DC

## 2019-12-25 DIAGNOSIS — J449 Chronic obstructive pulmonary disease, unspecified: Secondary | ICD-10-CM | POA: Diagnosis not present

## 2019-12-30 ENCOUNTER — Ambulatory Visit: Payer: Medicare Other | Admitting: Gastroenterology

## 2020-01-14 ENCOUNTER — Ambulatory Visit (INDEPENDENT_AMBULATORY_CARE_PROVIDER_SITE_OTHER): Payer: Medicare Other | Admitting: Internal Medicine

## 2020-01-14 ENCOUNTER — Encounter: Payer: Self-pay | Admitting: Internal Medicine

## 2020-01-14 ENCOUNTER — Other Ambulatory Visit: Payer: Self-pay

## 2020-01-14 VITALS — Ht 63.5 in | Wt 142.0 lb

## 2020-01-14 DIAGNOSIS — E876 Hypokalemia: Secondary | ICD-10-CM | POA: Diagnosis not present

## 2020-01-14 DIAGNOSIS — I10 Essential (primary) hypertension: Secondary | ICD-10-CM

## 2020-01-14 DIAGNOSIS — J449 Chronic obstructive pulmonary disease, unspecified: Secondary | ICD-10-CM | POA: Diagnosis not present

## 2020-01-14 DIAGNOSIS — E785 Hyperlipidemia, unspecified: Secondary | ICD-10-CM

## 2020-01-14 DIAGNOSIS — Z1231 Encounter for screening mammogram for malignant neoplasm of breast: Secondary | ICD-10-CM

## 2020-01-14 DIAGNOSIS — K580 Irritable bowel syndrome with diarrhea: Secondary | ICD-10-CM

## 2020-01-14 DIAGNOSIS — E2839 Other primary ovarian failure: Secondary | ICD-10-CM

## 2020-01-14 NOTE — Progress Notes (Signed)
Virtual Visit via Video Note  I connected with Kristen Bailey  on 01/14/20 at  2:20 PM EST by a video enabled telemedicine application and verified that I am speaking with the correct person using two identifiers.  Location patient: home Location provider:work or home office Persons participating in the virtual visit: patient, provider  I discussed the limitations of evaluation and management by telemedicine and the availability of in person appointments. The patient expressed understanding and agreed to proceed.   HPI: F/u  1. She wants to know about meds for IBS D appt 2/2 Dr. Bailey Mech. She is having problems with diarrhea at times 5x per day and abdominal bloating we discussed options Viberzi, cholestyramine/Welchol, metamucil/benefiber but feel this is best addressed by GI  2. She asks about supplements she is taking I.e red yeast rice, fish oil, B12, D3 1000 IU  And if she should continue we disc option of mvt instead of taking B12 and stopped red yeast rice as this can cause ab bloating as side effect  rec continue to take K and magnesium as she has low K and mag history   3. Disc CT ab/pelvis 10/28/19  IMPRESSION: 1. No acute findings within the abdomen or pelvis. No evidence of bowel obstruction or inflammation. 2. Stable adrenal adenomas. Densely calcified aortic atherosclerosis. Renal cortical thinning and scarring. 3. Kidney cysts   4. Copd stable on O2 continuously 2L and with exertion 3L-4L f/u Dr. Raul Del pulm last seen 10/06/19 she is no longer on trelegy on spiriva, symbicort and rescue inhaler    ROS: See pertinent positives and negatives per HPI.  Past Medical History:  Diagnosis Date  . Anxiety   . Chicken pox   . COPD (chronic obstructive pulmonary disease) ([place])   . Essential tremor   . Hypertension   . Hypomagnesemia     Past Surgical History:  Procedure Laterality Date  . ABDOMINAL HYSTERECTOMY    . APPENDECTOMY    . CHOLECYSTECTOMY    . OVARIAN CYST  REMOVAL    . SMALL INTESTINE SURGERY      Family History  Problem Relation Age of Onset  . Heart attack Mother   . Heart disease Mother   . Hypertension Mother   . Cancer Father        bladder smoker  . Heart disease Brother        MI in 47s   . Breast cancer Neg Hx     SOCIAL HX:  Has 4 children  Former smoker  Lives in in [place]   Current Outpatient Medications:  .  acetaminophen (TYLENOL) 500 MG tablet, Take 500 mg by mouth every 6 (six) hours as needed., Disp: , Rfl:  .  albuterol (VENTOLIN HFA) 108 (90 Base) MCG/ACT inhaler, Inhale 1-2 puffs into the lungs every 6 (six) hours as needed for wheezing or shortness of breath., Disp: , Rfl:  .  amLODipine (NORVASC) 5 MG tablet, Take 1 tablet (5 mg total) by mouth daily., Disp: 90 tablet, Rfl: 3 .  aspirin EC 81 MG tablet, Take 81 mg by mouth daily. , Disp: , Rfl:  .  budesonide-formoterol (SYMBICORT) 160-4.5 MCG/ACT inhaler, Inhale 2 puffs into the lungs 2 (two) times daily., Disp: , Rfl:  .  calcium carbonate (OSCAL) 1500 (600 Ca) MG TABS tablet, Take 1,500 mg by mouth 2 (two) times daily with a meal. , Disp: , Rfl:  .  Cholecalciferol (VITAMIN D-1000 MAX ST) 1000 units tablet, Take 1,000  Units by mouth daily. , Disp: , Rfl:  .  diclofenac sodium (VOLTAREN) 1 % GEL, Apply 2-4 g topically 4 (four) times daily. Prn 2 g upper body and 4 g lower body, Disp: 100 g, Rfl: 11 .  lisinopril-hydrochlorothiazide (ZESTORETIC) 10-12.5 MG tablet, Take 1 tablet by mouth daily. , Disp: , Rfl:  .  LORazepam (ATIVAN) 1 MG tablet, Take 1 mg by mouth 2 (two) times daily. , Disp: , Rfl:  .  ondansetron (ZOFRAN) 4 MG tablet, Take 1 tablet (4 mg total) by mouth every 8 (eight) hours as needed for nausea or vomiting., Disp: 90 tablet, Rfl: 0 .  potassium chloride SA (K-DUR) 20 MEQ tablet, Take 1 tablet (20 mEq total) by mouth daily., Disp: 90 tablet, Rfl: 3 .  Probiotic Product (ALIGN PO), Take by mouth daily., Disp: , Rfl:   .  tiotropium (SPIRIVA) 18 MCG inhalation capsule, Place 18 mcg into inhaler and inhale daily., Disp: , Rfl:   EXAM:  VITALS per patient if applicable:  GENERAL: alert, oriented, appears well and in no acute distress  PSYCH/NEURO: pleasant and cooperative, no obvious depression or anxiety, speech and thought processing grossly intact  ASSESSMENT AND PLAN:  Discussed the following assessment and plan:  Irritable bowel syndrome with diarrhea She wants to know about meds for IBS D appt 2/2 Dr. Bailey Mech. She is having problems with diarrhea at times 5x per day and abdominal bloating we discussed options Viberzi, cholestyramine/Welchol, metamucil/benefiber but feel this is best addressed by GI  Given ibs info to mail   Chronic obstructive pulmonary disease, unspecified COPD type ([place]) stable on O2 2L continuously increased with exertion 3-4 L  -f/u pulm Dr. Raul Del Cont meds   Hyperlipidemia, unspecified hyperlipidemia type -consider welchol with IBS history but will see what GI recs welchol could help HLD as well   Hypomagnesemia Hypokalemia Continue supplementation as chronic diarrhea low K and magnesium chronically   HM Flu 10/06/19 with pulm clinic Dr. Raul Del Check prior vaccines [facility] (per pt had 10/04/18 pna shot and flu)  01/18/20 moderna sch 1/2   Tdap 02/09/16  Out of age window pap, colonoscopy mammo 01/28/19 negativeordered mammogram [place] due 01/29/20  Consider DEXAif has not hadordered dexa can have with mammogram  Former smoker   GI Dr. Tiffany Kocher  Lung Dr. Rosita Kea Dr. Michaelene Song [facility] recordsROI sent again  Fasting labs spring 2021    -we discussed possible serious and likely etiologies, options for evaluation and workup, limitations of telemedicine visit vs in person visit, treatment, treatment risks and precautions. Pt prefers to treat via telemedicine empirically rather then risking or undertaking an in person visit at this moment. Patient  agrees to seek prompt in person care if worsening, new symptoms arise, or if is not improving with treatment.   I discussed the assessment and treatment plan with the patient. The patient was provided an opportunity to ask questions and all were answered. The patient agreed with the plan and demonstrated an understanding of the instructions.   The patient was advised to call back or seek an in-person evaluation if the symptoms worsen or if the condition fails to improve as anticipated.  Time spent 20-29 min Delorise Jackson, MD

## 2020-01-14 NOTE — Patient Instructions (Addendum)
Eluxadoline oral tablets/Viberzi-ask Dr. Vicente Males about this  Cholestryramine or welchol  Treatment for IBS above and below  Consider metamucil or benefiber  Ask GI    Petra Kuba made or Centrum Silver for women multivitamin    Cholestyramine powder for oral suspension What is this medicine? CHOLESTYRAMINE (koe LESS tir a meen) is used to lower cholesterol in patients who are at risk of heart disease or stroke. This medicine is only for patients whose cholesterol level is not controlled by diet. This medicine may be used for other purposes; ask your health care provider or pharmacist if you have questions. COMMON BRAND NAME(S): Locholest, Locholest Light, Prevalite, Questran, Questran Light What should I tell my health care provider before I take this medicine? They need to know if you have any of these conditions:  blocked bile duct  an unusual or allergic reaction to cholestyramine, other medicines, foods, dyes, or preservatives  pregnant or trying to get pregnant  breast-feeding How should I use this medicine? Do not take this medicine in the dry form. It must be mixed with a liquid before swallowing. Follow the directions on the prescription label. Place the powder in a glass or cup. Add between 2 and 6 ounces of fluid. This can be water, milk, pulpy fruit juice, fluid soup, or other liquid. Mix well and drink all of the liquid. Take your doses at regular intervals. Do not take your medicine more often than directed. Talk to your pediatrician regarding the use of this medicine in children. Special care may be needed. Overdosage: If you think you have taken too much of this medicine contact a poison control center or emergency room at once. NOTE: This medicine is only for you. Do not share this medicine with others. What if I miss a dose? If you miss a dose, take it as soon as you can. If it is almost time for your next dose, take only that dose. Do not take double or extra doses. What may  interact with this medicine?  diuretics  female hormones, like estrogens or progestins and birth control pills  heart medicines such as digoxin or digitoxin  penicillin G  phenobarbital  phenylbutazone  phytonadione  propranolol  tetracycline antibiotics  thyroid hormones  vitamin A  vitamin D  vitamin E  warfarin Take other drugs at least 1 hour before or 4 hours after this medicine, to avoid decreasing their absorption. This list may not describe all possible interactions. Give your health care provider a list of all the medicines, herbs, non-prescription drugs, or dietary supplements you use. Also tell them if you smoke, drink alcohol, or use illegal drugs. Some items may interact with your medicine. What should I watch for while using this medicine? Visit your doctor or health care professional for regular checks on your progress. Your blood fats and other tests will be measured from time to time. This medicine is only part of a total cholesterol-lowering program. Your health care professional or dietician can suggest a low-cholesterol and low-fat diet that will reduce your risk of getting heart and blood vessel disease. Avoid alcohol and smoking, and keep a proper exercise schedule. To reduce the chance of getting constipated, drink plenty of water and increase the amount of fiber in your diet. Ask your doctor or health care professional for advice if you are constipated. This medicine may cause a decrease in folic acid. You should make sure that you get enough folic acid while you are taking this medicine. Discuss the  foods you eat and the vitamins you take with your health care professional. What side effects may I notice from receiving this medicine? Side effects that you should report to your doctor or health care professional as soon as possible:  allergic reactions like skin rash, itching or hives, swelling of the face, lips, or tongue  bloody or black, tarry  stools  severe stomach pain with nausea and vomiting  unusual bleeding Side effects that usually do not require medical attention (report to your doctor or health care professional if they continue or are bothersome):  constipation or diarrhea  dizziness  headache  heartburn, indigestion  nausea, vomiting  perianal irritation This list may not describe all possible side effects. Call your doctor for medical advice about side effects. You may report side effects to FDA at 1-800-FDA-1088. Where should I keep my medicine? Keep out of the reach of children. Store at room temperature between 15 and 30 degrees C (59 and 86 degrees F). Throw away any unused medicine after the expiration date. NOTE: This sheet is a summary. It may not cover all possible information. If you have questions about this medicine, talk to your doctor, pharmacist, or health care provider.  2020 Elsevier/Gold Standard (2017-07-22 11:50:51)  Colesevelam tablets/welchol What is this medicine? COLESEVELAM (koh le SEV e lam) is used to lower cholesterol in patients who are at risk of heart disease or stroke. This medicine is only for patients whose cholesterol level is not controlled by diet. It is also used in combination with diet and exercise to help lower blood sugar in adults with type 2 diabetes. This medicine may be used for other purposes; ask your health care provider or pharmacist if you have questions. COMMON BRAND NAME(S): WelChol What should I tell my health care provider before I take this medicine? They need to know if you have any of these conditions:  constipation or bowel obstruction  high triglyceride levels  history of pancreatitis caused by high triglyceride levels  an unusual or allergic reaction to colesevelam, other medicines, foods, dyes, or preservatives  pregnant or trying to get pregnant  breast-feeding How should I use this medicine? Take this medicine by mouth with at least 4  ounces (half a glass) of water. Follow the directions on the prescription label. Take with food. Take your medicine at regular intervals. Do not take it more often than directed. Do not stop taking except on your doctor's advice. Talk to your pediatrician regarding the use of this medicine in children. Special care may be needed. Because of the tablet size, it is recommended that children use the oral suspension. Overdosage: If you think you have taken too much of this medicine contact a poison control center or emergency room at once. NOTE: This medicine is only for you. Do not share this medicine with others. What if I miss a dose? If you miss a dose, take it as soon as you can with your next meal. If it is almost time for your next dose, take only that dose. Do not take double or extra doses. What may interact with this medicine?  birth control pills  cyclosporine  insulin  medicines for diabetes like glimepiride, glipizide, and glyburide  medicines for seizures like carbamazepine, phenobarbital, phenytoin  metformin  olmesartan  thyroid hormones  verapamil  vitamins  warfarin This list may not describe all possible interactions. Give your health care provider a list of all the medicines, herbs, non-prescription drugs, or dietary supplements you use.  Also tell them if you smoke, drink alcohol, or use illegal drugs. Some items may interact with your medicine. What should I watch for while using this medicine? Visit your doctor or health care professional for regular checks on your progress. Your blood sugar and other tests will be measured regularly. This medicine is only part of a total cholesterol or blood sugar-lowering program. Your health care professional or dietician can suggest a low-cholesterol and low-fat diet that will reduce your risk of getting heart and blood vessel disease. Avoid alcohol and smoking, and keep a proper exercise schedule. To reduce the chance of  getting constipated, drink plenty of water and increase the amount of fiber in your diet. Ask your doctor or health care professional for advice if you are constipated. If you are taking this medicine for diabetes, wear a medical ID bracelet or chain, and carry a card that describes your disease and details of your medicine and dosage times. This medicine may cause a decrease in folic acid. You should make sure that you get enough folic acid while you are taking this medicine. Discuss the foods you eat and the vitamins you take with your health care professional. What side effects may I notice from receiving this medicine? Side effects that you should report to your doctor or health care professional as soon as possible:  allergic reactions like skin rash, itching or hives, swelling of the face, lips, or tongue  bloody or black, tarry stools  breathing problems  muscle pain  nausea, vomiting  severe stomach pain Side effects that usually do not require medical attention (report to your doctor or health care professional if they continue or are bothersome):  heartburn or indigestion  stomach upset This list may not describe all possible side effects. Call your doctor for medical advice about side effects. You may report side effects to FDA at 1-800-FDA-1088. Where should I keep my medicine? Keep out of the reach of children. Store at room temperature between 15 and 30 degrees C (59 and 86 degrees F). Protect from moisture. Throw away any unused medicine after the expiration date. NOTE: This sheet is a summary. It may not cover all possible information. If you have questions about this medicine, talk to your doctor, pharmacist, or health care provider.  2020 Elsevier/Gold Standard (2017-07-21 15:44:38)  What is this medicine? ELUXADOLINE (ee LUX ah dol ine) is an intestinal disorder drug. It is used to treat irritable bowel syndrome with diarrhea. This medicine may be used for other  purposes; ask your health care provider or pharmacist if you have questions. COMMON BRAND NAME(S): Viberzi What should I tell my health care provider before I take this medicine? They need to know if you have any of these conditions:  drink more than 3 alcohol-containing drinks per day  gallbladder disease  have no gallbladder  history of constipation  history of drug abuse or alcohol abuse problem  history of pancreatitis or pancreatic disease  kidney disease  liver disease  stomach or intestine problems  an unusual or allergic reaction to eluxadoline, other medicines, foods, dyes, or preservatives  pregnant or trying to get pregnant  breast-feeding How should I use this medicine? Take this medicine by mouth with a glass of water. Follow the directions on the prescription label. Take this medicine with food. Take your medicine at regular intervals. Do not take it more often than directed. Do not stop taking except on your doctor's advice. A special MedGuide will be given to  you by the pharmacist with each prescription and refill. Be sure to read this information carefully each time. Talk to your pediatrician regarding the use of this medicine in children. Special care may be needed. Patients over 12 years of age may have a stronger reaction to this medicine. Overdosage: If you think you have taken too much of this medicine contact a poison control center or emergency room at once. NOTE: This medicine is only for you. Do not share this medicine with others. What if I miss a dose? If you miss a dose, skip it. Take your next dose at the normal time. Do not take extra or 2 doses at the same time to make up for the missed dose. What may interact with this medicine? This medicine may interact with the following medications:  alosetron  antihistamines for allergy, cough and cold  atropine  certain medicines for bladder problems like oxybutynin, tolterodine  certain medicines  for HIV or hepatitis  certain medicines for stomach problems like dicyclomine, hyoscyamine  certain medicines for travel sickness like scopolamine  certain medicines for Parkinson's disease like benztropine, trihexyphenidyl  cyclosporine  eltrombopag  gemfibrozil  ipratropium  loperamide  narcotic medicines for pain  rifampin  rosuvastatin This list may not describe all possible interactions. Give your health care provider a list of all the medicines, herbs, non-prescription drugs, or dietary supplements you use. Also tell them if you smoke, drink alcohol, or use illegal drugs. Some items may interact with your medicine. What should I watch for while using this medicine? Visit your health care professional for regular checks on your progress. Tell your health care professional if your symptoms do not start to get better or if they get worse. This medicine may cause constipation. If you do not have a bowel movement for 3 days, call your health care professional. Dennis Bast may get drowsy or dizzy. Do not drive, use machinery, or do anything that needs mental alertness until you know how this medicine affects you. Do not stand or sit up quickly, especially if you are an older patient. This reduces the risk of dizzy or fainting spells. Alcohol may interfere with the effect of this medicine. Avoid alcoholic drinks. This medicine has a risk of abuse and dependence. Your health care provider will check you for this while you take this medicine. What side effects may I notice from receiving this medicine? Side effects that you should report to your doctor or health care professional as soon as possible:  allergic reactions like skin rash, itching or hives, swelling of the face, lips, or tongue  breathing problems  constipation  sudden or severe upper belly pain  vomiting Side effects that usually do not require medical attention (report to your doctor or health care professional if they  continue or are bothersome):  dizziness  nausea  tiredness This list may not describe all possible side effects. Call your doctor for medical advice about side effects. You may report side effects to FDA at 1-800-FDA-1088. Where should I keep my medicine? Keep out of the reach of children. This medicine can be abused. Keep your medicine in a safe place to protect it from theft. Do not share this medicine with anyone. Selling or giving away this medicine is dangerous and against the law. Store at room temperature between 20 and 25 degrees C (68 and 77 degrees F). Throw away any unused medicine after the expiration date. NOTE: This sheet is a summary. It may not cover all possible  information. If you have questions about this medicine, talk to your doctor, pharmacist, or health care provider.  2020 Elsevier/Gold Standard (2019-06-08 15:58:32)  Diet for Irritable Bowel Syndrome When you have irritable bowel syndrome (IBS), it is very important to eat the foods and follow the eating habits that are best for your condition. IBS may cause various symptoms such as pain in the abdomen, constipation, or diarrhea. Choosing the right foods can help to ease the discomfort from these symptoms. Work with your health care provider and diet and nutrition specialist (dietitian) to find the eating plan that will help to control your symptoms. What are tips for following this plan?      Keep a food diary. This will help you identify foods that cause symptoms. Write down: ? What you eat and when you eat it. ? What symptoms you have. ? When symptoms occur in relation to your meals, such as "pain in abdomen 2 hours after dinner."  Eat your meals slowly and in a relaxed setting.  Aim to eat 5-6 small meals per day. Do not skip meals.  Drink enough fluid to keep your urine pale yellow.  Ask your health care provider if you should take an over-the-counter probiotic to help restore healthy bacteria in your  gut (digestive tract). ? Probiotics are foods that contain good bacteria and yeasts.  Your dietitian may have specific dietary recommendations for you based on your symptoms. He or she may recommend that you: ? Avoid foods that cause symptoms. Talk with your dietitian about other ways to get the same nutrients that are in those problem foods. ? Avoid foods with gluten. Gluten is a protein that is found in rye, wheat, and barley. ? Eat more foods that contain soluble fiber. Examples of foods with high soluble fiber include oats, seeds, and certain fruits and vegetables. Take a fiber supplement if directed by your dietitian. ? Reduce or avoid certain foods called FODMAPs. These are foods that contain carbohydrates that are hard to digest. Ask your doctor which foods contain these carbohydrates. What foods are not recommended? The following are some foods and drinks that may make your symptoms worse:  Fatty foods, such as french fries.  Foods that contain gluten, such as pasta and cereal.  Dairy products, such as milk, cheese, and ice cream.  Chocolate.  Alcohol.  Products with caffeine, such as coffee.  Carbonated drinks, such as soda.  Foods that are high in FODMAPs. These include certain fruits and vegetables.  Products with sweeteners such as honey, high fructose corn syrup, sorbitol, and mannitol. The items listed above may not be a complete list of foods and beverages you should avoid. Contact a dietitian for more information. What foods are good sources of fiber? Your health care provider or dietitian may recommend that you eat more foods that contain fiber. Fiber can help to reduce constipation and other IBS symptoms. Add foods with fiber to your diet a little at a time so your body can get used to them. Too much fiber at one time might cause gas and swelling of your abdomen. The following are some foods that are good sources of fiber:  Berries, such as raspberries, strawberries,  and blueberries.  Tomatoes.  Carrots.  Brown rice.  Oats.  Seeds, such as chia and pumpkin seeds. The items listed above may not be a complete list of recommended sources of fiber. Contact your dietitian for more options. Where to find more information  [employer] for Functional  Gastrointestinal Disorders: www.iffgd.[employer] of Diabetes and Digestive and Kidney Diseases: [employer] Summary  When you have irritable bowel syndrome (IBS), it is very important to eat the foods and follow the eating habits that are best for your condition.  IBS may cause various symptoms such as pain in the abdomen, constipation, or diarrhea.  Choosing the right foods can help to ease the discomfort that comes from symptoms.  Keep a food diary. This will help you identify foods that cause symptoms.  Your health care provider or diet and nutrition specialist (dietitian) may recommend that you eat more foods that contain fiber. This information is not intended to replace advice given to you by your health care provider. Make sure you discuss any questions you have with your health care provider. Document Revised: 03/24/2019 Document Reviewed: 08/05/2017 Elsevier Patient Education  [place]  FODMAPs (fermentable oligosaccharides, disaccharides, monosaccharides, and polyols) are sugars that are hard for some people to digest. A low-FODMAP eating plan may help some people who have bowel (intestinal) diseases to manage their symptoms. This meal plan can be complicated to follow. Work with a diet and nutrition specialist (dietitian) to make a low-FODMAP eating plan that is right for you. A dietitian can make sure that you get enough nutrition from this diet. What are tips for following this plan? Reading food labels  Check labels for hidden FODMAPs such as: ? High-fructose syrup. ? Honey. ? Agave. ? Natural fruit flavors. ? Onion  or garlic powder.  Choose low-FODMAP foods that contain 3-4 grams of fiber per serving.  Check food labels for serving sizes. Eat only one serving at a time to make sure FODMAP levels stay low. Meal planning  Follow a low-FODMAP eating plan for up to 6 weeks, or as told by your health care provider or dietitian.  To follow the eating plan: 1. Eliminate high-FODMAP foods from your diet completely. 2. Gradually reintroduce high-FODMAP foods into your diet one at a time. Most people should wait a few days after introducing one high-FODMAP food before they introduce the next high-FODMAP food. Your dietitian can recommend how quickly you may reintroduce foods. 3. Keep a daily record of what you eat and drink, and make note of any symptoms that you have after eating. 4. Review your daily record with a dietitian regularly. Your dietitian can help you identify which foods you can eat and which foods you should avoid. General tips  Drink enough fluid each day to keep your urine pale yellow.  Avoid processed foods. These often have added sugar and may be high in FODMAPs.  Avoid most dairy products, whole grains, and sweeteners.  Work with a dietitian to make sure you get enough fiber in your diet. Recommended foods Grains  Gluten-free grains, such as rice, oats, buckwheat, quinoa, corn, polenta, and millet. Gluten-free pasta, bread, or cereal. Rice noodles. Corn tortillas. Vegetables  Eggplant, zucchini, cucumber, peppers, green beans, Brussels sprouts, bean sprouts, lettuce, arugula, kale, Swiss chard, spinach, collard greens, bok choy, summer squash, potato, and tomato. Limited amounts of corn, carrot, and sweet potato. Green parts of scallions. Fruits  Bananas, oranges, lemons, limes, blueberries, raspberries, strawberries, grapes, cantaloupe, honeydew melon, kiwi, papaya, passion fruit, and pineapple. Limited amounts of dried cranberries, banana chips, and shredded  coconut. Dairy  Lactose-free milk, yogurt, and kefir. Lactose-free cottage cheese and ice cream. Non-dairy milks, such as almond, coconut, hemp, and rice milk. Yogurts made of non-dairy milks. Limited amounts of  goat cheese, brie, mozzarella, parmesan, swiss, and other hard cheeses. Meats and other protein foods  Unseasoned beef, pork, poultry, or fish. Eggs. Berniece Salines. Tofu (firm) and tempeh. Limited amounts of nuts and seeds, such as almonds, walnuts, [place] nuts, pecans, peanuts, pumpkin seeds, chia seeds, and sunflower seeds. Fats and oils  Butter-free spreads. Vegetable oils, such as olive, canola, and sunflower oil. Seasoning and other foods  Artificial sweeteners with names that do not end in "ol" such as aspartame, saccharine, and stevia. Maple syrup, white table sugar, raw sugar, brown sugar, and molasses. Fresh basil, coriander, parsley, rosemary, and thyme. Beverages  Water and mineral water. Sugar-sweetened soft drinks. Small amounts of orange juice or cranberry juice. Black and green tea. Most dry wines. Coffee. This may not be a complete list of low-FODMAP foods. Talk with your dietitian for more information. Foods to avoid Grains  Wheat, including kamut, durum, and semolina. Barley and bulgur. Couscous. Wheat-based cereals. Wheat noodles, bread, crackers, and pastries. Vegetables  Chicory root, artichoke, asparagus, cabbage, snow peas, sugar snap peas, mushrooms, and cauliflower. Onions, garlic, leeks, and the white part of scallions. Fruits  Fresh, dried, and juiced forms of apple, pear, watermelon, peach, plum, cherries, apricots, blackberries, boysenberries, figs, nectarines, and mango. Avocado. Dairy  Milk, yogurt, ice cream, and soft cheese. Cream and sour cream. Milk-based sauces. Custard. Meats and other protein foods  Fried or fatty meat. Sausage. Cashews and pistachios. Soybeans, baked beans, black beans, chickpeas, kidney beans, fava beans, navy beans, lentils,  and split peas. Seasoning and other foods  Any sugar-free gum or candy. Foods that contain artificial sweeteners such as sorbitol, mannitol, isomalt, or xylitol. Foods that contain honey, high-fructose corn syrup, or agave. Bouillon, vegetable stock, beef stock, and chicken stock. Garlic and onion powder. Condiments made with onion, such as hummus, chutney, pickles, relish, salad dressing, and salsa. Tomato paste. Beverages  Chicory-based drinks. Coffee substitutes. Chamomile tea. Fennel tea. Sweet or fortified wines such as port or sherry. Diet soft drinks made with isomalt, mannitol, maltitol, sorbitol, or xylitol. Apple, pear, and mango juice. Juices with high-fructose corn syrup. This may not be a complete list of high-FODMAP foods. Talk with your dietitian to discuss what dietary choices are best for you.  Summary  A low-FODMAP eating plan is a short-term diet that eliminates FODMAPs from your diet to help ease symptoms of certain bowel diseases.  The eating plan usually lasts up to 6 weeks. After that, high-FODMAP foods are restarted gradually, one at a time, so you can find out which may be causing symptoms.  A low-FODMAP eating plan can be complicated. It is best to work with a dietitian who has experience with this type of plan. This information is not intended to replace advice given to you by your health care provider. Make sure you discuss any questions you have with your health care provider. Document Revised: 11/14/2017 Document Reviewed: 07/29/2017 Elsevier Patient Education  [place].

## 2020-01-18 ENCOUNTER — Other Ambulatory Visit: Payer: Self-pay

## 2020-01-18 ENCOUNTER — Encounter: Payer: Self-pay | Admitting: Gastroenterology

## 2020-01-18 ENCOUNTER — Ambulatory Visit: Payer: Medicare Other | Admitting: Gastroenterology

## 2020-01-18 VITALS — BP 159/84 | HR 108 | Temp 98.0°F | Wt 144.5 lb

## 2020-01-18 DIAGNOSIS — R197 Diarrhea, unspecified: Secondary | ICD-10-CM | POA: Diagnosis not present

## 2020-01-18 MED ORDER — CHOLESTYRAMINE 4 G PO PACK: 4.0000 g | PACK | Freq: Every day | ORAL | 12 refills | Status: DC

## 2020-01-18 NOTE — Progress Notes (Signed)
Jonathon Bellows MD, MRCP(U.K) [address]  [place]  [place],  [address]  Main: [phone]  Fax: [phone]   Gastroenterology Consultation  Referring Provider:     McLean-Scocuzza, Olivia Mackie * Primary Care Physician:  McLean-Scocuzza, Nino Glow, MD Primary Gastroenterologist:  Dr. Jonathon Bellows  Reason for Consultation:     Diarrhea        HPI:   Kristen Bailey is a 86 y.o. y/o female referred for consultation & management  by Dr. Terese Door, Nino Glow, MD.    10/28/2019: CT scan of the abdomen no acute findings stable adrenal adenomas. No recent stool studies.  08/17/2019 TSH normal creatinine 0.95 but hepatic function panel is normal.  No recent CBC.  She says that she has had diarrhea for many years over 15 years since she had her cholecystectomy.  She has between 2-3 bowel movements a day sometimes watery sometimes clumpy.  Certain foods seem to make it worse.  She denies consumption of any artificial sugars.  She does not call in view of any diet sodas.  Not on any probiotics.  Denies any blood in the stool.  She says she has been told she has irritable bowel syndrome for over 15 years.  She used to follow with Dr. Vira Agar many years back.  She has tried Imodium over-the-counter and it has helped in the past.  Past Medical History:  Diagnosis Date  . Anxiety   . Chicken pox   . COPD (chronic obstructive pulmonary disease) ([place])   . Essential tremor   . Hypertension   . Hypomagnesemia     Past Surgical History:  Procedure Laterality Date  . ABDOMINAL HYSTERECTOMY    . APPENDECTOMY    . CHOLECYSTECTOMY    . OVARIAN CYST REMOVAL    . SMALL INTESTINE SURGERY      Prior to Admission medications   Medication Sig Start Date End Date Taking? Authorizing Provider  acetaminophen (TYLENOL) 500 MG tablet Take 500 mg by mouth every 6 (six) hours as needed.   Yes [provider]  albuterol (VENTOLIN HFA) 108 (90 Base) MCG/ACT inhaler Inhale 1-2 puffs into  the lungs every 6 (six) hours as needed for wheezing or shortness of breath.   Yes [provider]  amLODipine (NORVASC) 5 MG tablet Take 1 tablet (5 mg total) by mouth daily. 11/30/19  Yes McLean-Scocuzza, Nino Glow, MD  aspirin EC 81 MG tablet Take 81 mg by mouth daily.    Yes [provider]  budesonide-formoterol (SYMBICORT) 160-4.5 MCG/ACT inhaler Inhale 2 puffs into the lungs 2 (two) times daily.   Yes [provider]  calcium carbonate (OSCAL) 1500 (600 Ca) MG TABS tablet Take 1,500 mg by mouth 2 (two) times daily with a meal.    Yes [provider]  Cholecalciferol (VITAMIN D-1000 MAX ST) 1000 units tablet Take 1,000 Units by mouth daily.    Yes [provider]  diclofenac sodium (VOLTAREN) 1 % GEL Apply 2-4 g topically 4 (four) times daily. Prn 2 g upper body and 4 g lower body 10/13/19  Yes McLean-Scocuzza, Nino Glow, MD  LORazepam (ATIVAN) 1 MG tablet Take 1 mg by mouth 2 (two) times daily.  09/06/16  Yes [provider]  Magnesium 250 MG TABS Take by mouth daily.   Yes [provider]  ondansetron (ZOFRAN) 4 MG tablet Take 1 tablet (4 mg total) by mouth every 8 (eight) hours as needed for nausea or vomiting. 10/13/19  Yes McLean-Scocuzza,  Nino Glow, MD  potassium chloride SA (K-DUR) 20 MEQ tablet Take 1 tablet (20 mEq total) by mouth daily. 08/03/19 11/27/20 Yes McLean-Scocuzza, Nino Glow, MD  Probiotic Product (ALIGN PO) Take by mouth daily.   Yes [provider]  tiotropium (SPIRIVA) 18 MCG inhalation capsule Place 18 mcg into inhaler and inhale daily.   Yes [provider]  lisinopril-hydrochlorothiazide (ZESTORETIC) 10-12.5 MG tablet Take 1 tablet by mouth daily.  07/19/16 01/14/20  [provider]    Family History  Problem Relation Age of Onset  . Heart attack Mother   . Heart disease Mother   . Hypertension Mother   . Cancer Father        bladder smoker  . Heart disease Brother        MI in 46s     . Breast cancer Neg Hx      Social History   Tobacco Use  . Smoking status: Former Research scientist (life sciences)  . Smokeless tobacco: Never Used  Substance Use Topics  . Alcohol use: No  . Drug use: No    Allergies as of 01/18/2020 - Review Complete 01/18/2020  Allergen Reaction Noted  . Trelegy ellipta [fluticasone-umeclidin-vilant]  08/03/2019  . Codeine Nausea And Vomiting 05/23/2015  . Lipitor [atorvastatin] Nausea And Vomiting 05/23/2015  . Omeprazole  05/23/2015  . Vicodin [hydrocodone-acetaminophen]  05/23/2015  . Zyban [bupropion]  05/23/2015    Review of Systems:    All systems reviewed and negative except where noted in HPI.   Physical Exam:  BP (!) 159/84 (BP Location: Left Arm, Patient Position: Sitting, Cuff Size: Normal)   Pulse (!) 108   Temp 98 F (36.7 C) (Oral)   Wt 144 lb 8 oz (65.5 kg)   BMI 25.20 kg/m  No LMP recorded. Patient has had a hysterectomy. Psych:  Alert and cooperative. Normal mood and affect.  On oxygen General:   Alert, very thin,  pleasant and cooperative in NAD Head:  Normocephalic and atraumatic. Eyes:  Sclera clear, no icterus.   Conjunctiva pink. Neck:  Supple; no masses or thyromegaly. Lungs: Decreased air entry bilaterally no added breath sounds. Heart:  Regular rate and rhythm; no murmurs, clicks, rubs, or gallops. Abdomen:  Normal bowel sounds.  No bruits.  Soft, non-tender and non-distended without masses, hepatosplenomegaly or hernias noted.  No guarding or rebound tenderness.    Neurologic:  Alert and oriented x3;  grossly normal neurologically. Psych:  Alert and cooperative. Normal mood and affect.  Imaging Studies: No results found.  Assessment and Plan:   Kristen Bailey is a 86 y.o. y/o female has been referred for longstanding diarrhea for over 15 years.  Began after she had a cholecystectomy at that time.  She says she has been diagnosed with irritable bowel syndrome in the past.  She has responded to Imodium which she has tried  in the past.  Plan 1.  Rule out infection with stool tests and obtain fecal calprotectin to rule out inflammation in the colon. 2.  Commence on Questran 1 sachet a day for bile salt mediated diarrhea. 3.  Low FODMAP diet.    Follow up in 2 weeks telephone visit  Dr Jonathon Bellows MD,MRCP(U.K)

## 2020-01-25 DIAGNOSIS — J449 Chronic obstructive pulmonary disease, unspecified: Secondary | ICD-10-CM | POA: Diagnosis not present

## 2020-01-27 DIAGNOSIS — R197 Diarrhea, unspecified: Secondary | ICD-10-CM | POA: Diagnosis not present

## 2020-02-04 ENCOUNTER — Ambulatory Visit (INDEPENDENT_AMBULATORY_CARE_PROVIDER_SITE_OTHER): Payer: Medicare Other

## 2020-02-04 ENCOUNTER — Other Ambulatory Visit: Payer: Self-pay

## 2020-02-04 VITALS — Ht 63.5 in | Wt 144.0 lb

## 2020-02-04 DIAGNOSIS — Z0000 Encounter for general adult medical examination without abnormal findings: Secondary | ICD-10-CM | POA: Diagnosis not present

## 2020-02-04 NOTE — Patient Instructions (Addendum)
  Kristen Bailey , Thank you for taking time to come for your Medicare Wellness Visit. I appreciate your ongoing commitment to your health goals. Please review the following plan we discussed and let me know if I can assist you in the future.   These are the goals we discussed: Goals    . Increase physical activity     As tolerated       This is a list of the screening recommended for you and due dates:  Health Maintenance  Topic Date Due  . DEXA scan (bone density measurement)  11/20/2000  . Pneumonia vaccines (2 of 2 - PCV13) 01/22/2005  . Tetanus Vaccine  02/08/2026  . Flu Shot  Completed

## 2020-02-04 NOTE — Progress Notes (Addendum)
Subjective:   Kristen Bailey is a 86 y.o. female who presents for an Initial Medicare Annual Wellness Visit.  Review of Systems    No ROS.  Medicare Wellness Virtual Visit.  Visual/audio telehealth visit, UTA vital signs.   Ht/Wt provided  See social history for additional risk factors.    Cardiac Risk Factors include: advanced age (>3men, >72 women);hypertension     Objective:    Today's Vitals   02/04/20 1307  Weight: 144 lb (65.3 kg)  Height: 5' 3.5" (1.613 m)   Body mass index is 25.11 kg/m.  Advanced Directives 02/04/2020 05/05/2019 06/24/2017 11/22/2016 02/09/2016  Does Patient Have a Medical Advance Directive? Yes Yes Yes Yes No  Type of Advance Directive Healthcare Power of Attorney Living will;Healthcare Power of [place];Living will [place];Living will -  Does patient want to make changes to medical advance directive? No - Patient declined - - - -  Copy of [place] in Chart? No - copy requested - No - copy requested - -  Would patient like information on creating a medical advance directive? - - - - No - patient declined information    Current Medications (verified) Outpatient Encounter Medications as of 02/04/2020  Medication Sig   acetaminophen (TYLENOL) 500 MG tablet Take 500 mg by mouth every 6 (six) hours as needed.   albuterol (VENTOLIN HFA) 108 (90 Base) MCG/ACT inhaler Inhale 1-2 puffs into the lungs every 6 (six) hours as needed for wheezing or shortness of breath.   amLODipine (NORVASC) 5 MG tablet Take 1 tablet (5 mg total) by mouth daily.   aspirin EC 81 MG tablet Take 81 mg by mouth daily.    budesonide-formoterol (SYMBICORT) 160-4.5 MCG/ACT inhaler Inhale 2 puffs into the lungs 2 (two) times daily.   calcium carbonate (OSCAL) 1500 (600 Ca) MG TABS tablet Take 1,500 mg by mouth 2 (two) times daily with a meal.    Cholecalciferol (VITAMIN D-1000 MAX ST) 1000 units tablet Take  1,000 Units by mouth daily.    cholestyramine (QUESTRAN) 4 g packet Take 1 packet (4 g total) by mouth daily before breakfast for 1 dose.   diclofenac sodium (VOLTAREN) 1 % GEL Apply 2-4 g topically 4 (four) times daily. Prn 2 g upper body and 4 g lower body   lisinopril-hydrochlorothiazide (ZESTORETIC) 10-12.5 MG tablet Take 1 tablet by mouth daily.    LORazepam (ATIVAN) 1 MG tablet Take 1 mg by mouth 2 (two) times daily.    Magnesium 250 MG TABS Take by mouth daily.   ondansetron (ZOFRAN) 4 MG tablet Take 1 tablet (4 mg total) by mouth every 8 (eight) hours as needed for nausea or vomiting.   potassium chloride SA (K-DUR) 20 MEQ tablet Take 1 tablet (20 mEq total) by mouth daily.   Probiotic Product (ALIGN PO) Take by mouth daily.   tiotropium (SPIRIVA) 18 MCG inhalation capsule Place 18 mcg into inhaler and inhale daily.   No facility-administered encounter medications on file as of 02/04/2020.    Allergies (verified) Trelegy ellipta [fluticasone-umeclidin-vilant], Codeine, Lipitor [atorvastatin], Omeprazole, Vicodin [hydrocodone-acetaminophen], and Zyban [bupropion]   History: Past Medical History:  Diagnosis Date   Anxiety    Chicken pox    COPD (chronic obstructive pulmonary disease) ([place])    Essential tremor    Hypertension    Hypomagnesemia    Past Surgical History:  Procedure Laterality Date   ABDOMINAL HYSTERECTOMY     APPENDECTOMY  CHOLECYSTECTOMY     OVARIAN CYST REMOVAL     SMALL INTESTINE SURGERY     Family History  Problem Relation Age of Onset   Heart attack Mother    Heart disease Mother    Hypertension Mother    Cancer Father        bladder smoker   Heart disease Brother        MI in 9s    Breast cancer Neg Hx    Social History   Socioeconomic History   Marital status: Widowed    Spouse name: Not on file   Number of children: Not on file   Years of education: Not on file   Highest education level: Not on file  Occupational History   Not on  file  Tobacco Use   Smoking status: Former Smoker   Smokeless tobacco: Never Used  Substance and Sexual Activity   Alcohol use: No   Drug use: No   Sexual activity: Not Currently  Other Topics Concern   Not on file  Social History Narrative   Has 4 children    Former smoker    Lives in in retirement [place] Strain:    Difficulty of Paying Living Expenses: Not on file  Food Insecurity:    Worried About Charity fundraiser in the Last Year: Not on file   [employer] of Food in the Last Year: Not on file  Transportation Needs:    Lack of Transportation (Medical): Not on file   Lack of Transportation (Non-Medical): Not on file  Physical Activity:    Days of Exercise per Week: Not on file   Minutes of Exercise per Session: Not on file  Stress:    Feeling of Stress : Not on file  Social Connections:    Frequency of Communication with Friends and Family: Not on file   Frequency of Social Gatherings with Friends and Family: Not on file   Attends Religious Services: Not on file   Active Member of Clubs or Organizations: Not on file   Attends Archivist Meetings: Not on file   Marital Status: Not on file    Tobacco Counseling Counseling given: Not Answered   Clinical Intake:  Pre-visit preparation completed: Yes        Diabetes: No  How often do you need to have someone help you when you read instructions, pamphlets, or other written materials from your doctor or pharmacy?: 1 - Never  Interpreter Needed?: No      Activities of Daily Living In your present state of health, do you have any difficulty performing the following activities: 02/04/2020  Hearing? N  Vision? N  Difficulty concentrating or making decisions? N  Walking or climbing stairs? Y  Comment Unsteady gait, cane/walker in use  Dressing or bathing? N  Doing errands, shopping? Y  Comment Drives with someone at all  times  Preparing Food and eating ? Y  Comment She does not cook. Staff prepares meals. Self feeds.  Using the Toilet? N  In the past six months, have you accidently leaked urine? N  Do you have problems with loss of bowel control? N  Managing your Medications? N  Managing your Finances? N  Housekeeping or managing your Housekeeping? Y  Comment Maid assist and daughter does her laundry  Some recent data might be hidden  Immunizations and Health Maintenance Immunization History  Administered Date(s) Administered   Influenza-Unspecified 09/17/2012, 09/01/2015, 09/27/2019   Moderna SARS-COVID-2 Vaccination 01/18/2020   Pneumococcal-Unspecified 01/23/2004   Tdap 11/18/2014, 02/09/2016   Health Maintenance Due  Topic Date Due   DEXA SCAN  11/20/2000   PNA vac Low Risk Adult (2 of 2 - PCV13) 01/22/2005    Patient Care Team: McLean-Scocuzza, Nino Glow, MD as PCP - General (Internal Medicine)  Indicate any recent Medical Services you may have received from other than [facility] providers in the past year (date may be approximate).     Assessment:   This is a routine wellness examination for [place].  Nurse connected with patient 02/04/20 at  1:00 PM EST by a telephone enabled telemedicine application and verified that I am speaking with the correct person using two identifiers. Patient stated full name and DOB. Patient gave permission to continue with virtual visit. Patient's location was at home and Nurse's location was at [place] office.   Patient is alert and oriented x3. Patient denies difficulty focusing or concentrating. Patient likes to read her Bible and books daily for brain stimulation.   Health Maintenance Due: -PNA vaccine- discussed; to be completed with doctor in visit or local pharmacy.  See completed HM at the end of note.   Eye: Visual acuity not assessed. Virtual visit. Followed by their ophthalmologist.  Dental: Dentures- yes  Hearing: Demonstrates normal  hearing during visit.  Safety:  Patient feels safe at home- yes Patient does have smoke detectors at home- yes Patient does wear sunscreen or protective clothing when in direct sunlight - yes Patient does wear seat belt when in a moving vehicle - yes Patient drives- yes Adequate lighting in walkways free from debris- yes Grab bars and handrails used as appropriate- yes Ambulates with an assistive device- yes; walker/cane. Lifeline on person when ambulating outside of the home- yes  Social: Alcohol intake - no     Smoking history- former  Smokers in home? none Illicit drug use? none  Medication: Taking as directed and without issues.  Self managed - yes   Covid-19: Precautions and sickness symptoms discussed. Wears mask, social distancing, hand hygiene as appropriate.   Activities of Daily Living Patient denies needing assistance with: feeding themselves, getting from bed to chair, getting to the toilet, bathing/showering, dressing, managing money.  Assisted by maid assist with household chores and meal prep. Daughter assist when laundry.    Discussed the importance of a healthy diet, water intake and the benefits of aerobic exercise.   Physical activity- active around the home, no routine.  Diet:  Regular Water: good intake Caffeine: 1 cup of coffee  Other Providers Patient Care Team: McLean-Scocuzza, Nino Glow, MD as PCP - General (Internal Medicine)  Hearing/Vision screen  Hearing Screening   125Hz  250Hz  500Hz  1000Hz  2000Hz  3000Hz  4000Hz  6000Hz  8000Hz   Right ear:           Left ear:           Comments: Patient is able to hear conversational tones without difficulty.  No issues reported.  Vision Screening Comments: Wears corrective lenses Cataract extraction, bilateral Visual acuity not assessed, virtual visit.  They have seen their ophthalmologist.     Dietary issues and exercise activities discussed: Current Exercise Habits: The patient does not participate  in regular exercise at present  Goals      Increase physical activity     As tolerated       Depression Screen PHQ  2/9 Scores 02/04/2020 01/14/2020 10/13/2019  PHQ - 2 Score 0 0 0    Fall Risk Fall Risk  02/04/2020 01/14/2020 10/13/2019 08/03/2019  Falls in the past year? 0 0 0 0  Number falls in past yr: - - - 0  Injury with Fall? - - - 0  Follow up Falls evaluation completed Falls evaluation completed - Falls evaluation completed    Timed Get Up and Go Performed no, virtual visit  Cognitive Function:     6CIT Screen 02/04/2020  What Year? 0 points  What month? 0 points  What time? 0 points  Count back from 20 0 points  Months in reverse 0 points    Screening Tests Health Maintenance  Topic Date Due   DEXA SCAN  11/20/2000   PNA vac Low Risk Adult (2 of 2 - PCV13) 01/22/2005   TETANUS/TDAP  02/08/2026   INFLUENZA VACCINE  Completed     Plan:   Keep all routine maintenance appointments.   Next scheduled fasting lab 02/16/20 @ 10:00  Follow up 03/22/20 @ 1:30  Medicare Attestation I have personally reviewed: The patient's medical and social history Their use of alcohol, tobacco or illicit drugs Their current medications and supplements The patient's functional ability including ADLs,fall risks, home safety risks, cognitive, and hearing and visual impairment Diet and physical activities Evidence for depression   I have reviewed and discussed with patient certain preventive protocols, quality metrics, and best practice recommendations.     Varney Biles, LPN   [id number]     Agree TMS

## 2020-02-06 ENCOUNTER — Encounter: Payer: Self-pay | Admitting: Gastroenterology

## 2020-02-07 ENCOUNTER — Ambulatory Visit (INDEPENDENT_AMBULATORY_CARE_PROVIDER_SITE_OTHER): Payer: Medicare Other | Admitting: Gastroenterology

## 2020-02-07 DIAGNOSIS — K9089 Other intestinal malabsorption: Secondary | ICD-10-CM | POA: Diagnosis not present

## 2020-02-07 LAB — GI PROFILE, STOOL, PCR

## 2020-02-07 LAB — C DIFFICILE, CYTOTOXIN B

## 2020-02-07 LAB — C DIFFICILE TOXINS A+B W/RFLX: C difficile Toxins A+B, EIA: NEGATIVE

## 2020-02-07 LAB — CALPROTECTIN, FECAL: Calprotectin, Fecal: 29 ug/g (ref 0–120)

## 2020-02-07 NOTE — Progress Notes (Signed)
Jonathon Bellows , MD [address]  [place], [place] [address]  Main: [phone]  Fax: [phone]   Primary Care Physician: McLean-Scocuzza, Nino Glow, MD  Virtual Visit via Telephone Note  I connected with patient on 02/07/20 at  1:15 PM EST by telephone and verified that I am speaking with the correct person using two identifiers.   I discussed the limitations, risks, security and privacy concerns of performing an evaluation and management service by telephone and the availability of in person appointments. I also discussed with the patient that there may be a patient responsible charge related to this service. The patient expressed understanding and agreed to proceed.  Location of Patient: Home Location of Provider: Home Persons involved: Patient and provider only   History of Present Illness:   Diarrhea follow up   Summary of history :  HPI: Kristen Bailey is a 86 y.o. female is here to see me as a follow-up visit.  She was initially referred to see me 3 weeks back in February 2021 for diarrhea which has had more than 15 years , began after her cholecystectomy.She has between 2-3 bowel movements a day sometimes watery sometimes clumpy.  Certain foods seem to make it worse.  She denies consumption of any artificial sugars.  She does not call in view of any diet sodas.  Not on any probiotics.  Denies any blood in the stool.  She says she has been told she has irritable bowel syndrome for over 15 years.  She used to follow with Dr. Vira Agar many years back.  She has tried Imodium over-the-counter and it has helped in the past.    10/28/2019: CT scan of the abdomen no acute findings stable adrenal adenomas. No recent stool studies.  08/17/2019 TSH normal creatinine 0.95 but hepatic function panel is normal.  No recent CBC.  Interval history  01/18/2020-02/07/2020  01/27/2020: Stool tests negative for infection Since commencing on Questran she has 1 bowel  movement every day or every other day.  Feels very well no questions and no complaints.    Current Outpatient Medications  Medication Sig Dispense Refill  . acetaminophen (TYLENOL) 500 MG tablet Take 500 mg by mouth every 6 (six) hours as needed.    Marland Kitchen albuterol (VENTOLIN HFA) 108 (90 Base) MCG/ACT inhaler Inhale 1-2 puffs into the lungs every 6 (six) hours as needed for wheezing or shortness of breath.    Marland Kitchen amLODipine (NORVASC) 5 MG tablet Take 1 tablet (5 mg total) by mouth daily. 90 tablet 3  . aspirin EC 81 MG tablet Take 81 mg by mouth daily.     . budesonide-formoterol (SYMBICORT) 160-4.5 MCG/ACT inhaler Inhale 2 puffs into the lungs 2 (two) times daily.    . calcium carbonate (OSCAL) 1500 (600 Ca) MG TABS tablet Take 1,500 mg by mouth 2 (two) times daily with a meal.     . Cholecalciferol (VITAMIN D-1000 MAX ST) 1000 units tablet Take 1,000 Units by mouth daily.     . cholestyramine (QUESTRAN) 4 g packet Take 1 packet (4 g total) by mouth daily before breakfast for 1 dose. 60 each 12  . diclofenac sodium (VOLTAREN) 1 % GEL Apply 2-4 g topically 4 (four) times daily. Prn 2 g upper body and 4 g lower body 100 g 11  . lisinopril-hydrochlorothiazide (ZESTORETIC) 10-12.5 MG tablet Take 1 tablet by mouth daily.     Marland Kitchen LORazepam (ATIVAN) 1 MG tablet Take 1 mg by mouth  2 (two) times daily.     . Magnesium 250 MG TABS Take by mouth daily.    . ondansetron (ZOFRAN) 4 MG tablet Take 1 tablet (4 mg total) by mouth every 8 (eight) hours as needed for nausea or vomiting. 90 tablet 0  . potassium chloride SA (K-DUR) 20 MEQ tablet Take 1 tablet (20 mEq total) by mouth daily. 90 tablet 3  . Probiotic Product (ALIGN PO) Take by mouth daily.    Marland Kitchen tiotropium (SPIRIVA) 18 MCG inhalation capsule Place 18 mcg into inhaler and inhale daily.     No current facility-administered medications for this visit.    Allergies as of 02/07/2020 - Review Complete 02/04/2020  Allergen Reaction Noted  . Trelegy ellipta  [fluticasone-umeclidin-vilant]  08/03/2019  . Codeine Nausea And Vomiting 05/23/2015  . Lipitor [atorvastatin] Nausea And Vomiting 05/23/2015  . Omeprazole  05/23/2015  . Vicodin [hydrocodone-acetaminophen]  05/23/2015  . Zyban [bupropion]  05/23/2015    Review of Systems:    All systems reviewed and negative except where noted in HPI.   Observations/Objective:  Labs: CMP     Component Value Date/Time   NA 143 08/17/2019 0902   NA 139 12/05/2012 1326   K 3.8 08/17/2019 0902   K 3.5 12/05/2012 1326   CL 101 08/17/2019 0902   CL 101 12/05/2012 1326   CO2 33 (H) 08/17/2019 0902   CO2 34 (H) 12/05/2012 1326   GLUCOSE 102 (H) 08/17/2019 0902   GLUCOSE 113 (H) 12/05/2012 1326   BUN 31 (H) 08/17/2019 0902   BUN 24 (H) 12/05/2012 1326   CREATININE 0.95 08/17/2019 0902   CREATININE 0.83 12/05/2012 1326   CALCIUM 9.5 08/17/2019 0902   CALCIUM 10.0 12/05/2012 1326   PROT 6.2 08/17/2019 0902   ALBUMIN 4.1 08/17/2019 0902   AST 13 08/17/2019 0902   ALT 7 08/17/2019 0902   ALKPHOS 50 08/17/2019 0902   BILITOT 1.1 08/17/2019 0902   GFRNONAA >60 05/05/2019 1053   GFRNONAA >60 12/05/2012 1326   GFRAA >60 05/05/2019 1053   GFRAA >60 12/05/2012 1326   Lab Results  Component Value Date   WBC 6.8 05/05/2019   HGB 12.1 05/05/2019   HCT 38.2 05/05/2019   MCV 93.6 05/05/2019   PLT 222 05/05/2019    Imaging Studies: No results found.  Assessment and Plan:   Kristen Bailey is a 86 y.o. y/o female here to follow up for longstanding diarrhea for over 15 years.  Began after she had a cholecystectomy at that time.    I started her on Questran the last visit and since then her diarrhea has resolved.  I advised her that she can also decrease the quantity of Questran if it is causing her to get constipated.  She has no other complaints or concerns today and she is welcome to contact our office if she were to need any further assistance.   I discussed the assessment and treatment  plan with the patient. The patient was provided an opportunity to ask questions and all were answered. The patient agreed with the plan and demonstrated an understanding of the instructions.   The patient was advised to call back or seek an in-person evaluation if the symptoms worsen or if the condition fails to improve as anticipated.  I provided 12 minutes of non-face-to-face time during this encounter.  Dr Jonathon Bellows MD,MRCP [facility]) Gastroenterology/Hepatology Pager: [phone]   Speech recognition software was used to dictate this note.

## 2020-02-09 DIAGNOSIS — R0600 Dyspnea, unspecified: Secondary | ICD-10-CM | POA: Diagnosis not present

## 2020-02-09 DIAGNOSIS — J449 Chronic obstructive pulmonary disease, unspecified: Secondary | ICD-10-CM | POA: Diagnosis not present

## 2020-02-09 DIAGNOSIS — Z9981 Dependence on supplemental oxygen: Secondary | ICD-10-CM | POA: Diagnosis not present

## 2020-02-16 ENCOUNTER — Other Ambulatory Visit: Payer: Medicare Other

## 2020-02-22 DIAGNOSIS — J449 Chronic obstructive pulmonary disease, unspecified: Secondary | ICD-10-CM | POA: Diagnosis not present

## 2020-02-23 ENCOUNTER — Other Ambulatory Visit (INDEPENDENT_AMBULATORY_CARE_PROVIDER_SITE_OTHER): Payer: Medicare Other

## 2020-02-23 ENCOUNTER — Other Ambulatory Visit: Payer: Self-pay

## 2020-02-23 DIAGNOSIS — I10 Essential (primary) hypertension: Secondary | ICD-10-CM

## 2020-02-23 DIAGNOSIS — E876 Hypokalemia: Secondary | ICD-10-CM

## 2020-02-23 DIAGNOSIS — E785 Hyperlipidemia, unspecified: Secondary | ICD-10-CM | POA: Diagnosis not present

## 2020-02-23 LAB — COMPREHENSIVE METABOLIC PANEL
ALT: 10 U/L (ref 0–35)
AST: 15 U/L (ref 0–37)
Albumin: 4 g/dL (ref 3.5–5.2)
Alkaline Phosphatase: 52 U/L (ref 39–117)
BUN: 27 mg/dL — ABNORMAL HIGH (ref 6–23)
CO2: 33 mEq/L — ABNORMAL HIGH (ref 19–32)
Calcium: 9.5 mg/dL (ref 8.4–10.5)
Chloride: 101 mEq/L (ref 96–112)
Creatinine, Ser: 0.91 mg/dL (ref 0.40–1.20)
GFR: 58.86 mL/min — ABNORMAL LOW (ref >60.00)
Glucose, Bld: 109 mg/dL — ABNORMAL HIGH (ref 70–99)
Potassium: 3.6 mEq/L (ref 3.5–5.1)
Sodium: 139 mEq/L (ref 135–145)
Total Bilirubin: 1 mg/dL (ref 0.2–1.2)
Total Protein: 6.6 g/dL (ref 6.0–8.3)

## 2020-02-23 LAB — LIPID PANEL
Cholesterol: 238 mg/dL — ABNORMAL HIGH (ref 0–200)
HDL: 86 mg/dL (ref >39.00)
LDL Cholesterol: 124 mg/dL — ABNORMAL HIGH (ref 0–99)
NonHDL: 151.72
Total CHOL/HDL Ratio: 3
Triglycerides: 141 mg/dL (ref 0.0–149.0)
VLDL: 28.2 mg/dL (ref 0.0–40.0)

## 2020-02-23 LAB — CBC WITH DIFFERENTIAL/PLATELET
Basophils Absolute: 0.1 10*3/uL (ref 0.0–0.1)
Basophils Relative: 1.1 % (ref 0.0–3.0)
Eosinophils Absolute: 0.1 10*3/uL (ref 0.0–0.7)
Eosinophils Relative: 1.9 % (ref 0.0–5.0)
HCT: 37.8 % (ref 36.0–46.0)
Hemoglobin: 12.6 g/dL (ref 12.0–15.0)
Lymphocytes Relative: 23.6 % (ref 12.0–46.0)
Lymphs Abs: 1.4 10*3/uL (ref 0.7–4.0)
MCHC: 33.2 g/dL (ref 30.0–36.0)
MCV: 91.5 fl (ref 78.0–100.0)
Monocytes Absolute: 0.4 10*3/uL (ref 0.1–1.0)
Monocytes Relative: 7 % (ref 3.0–12.0)
Neutro Abs: 3.9 10*3/uL (ref 1.4–7.7)
Neutrophils Relative %: 66.4 % (ref 43.0–77.0)
Platelets: 292 10*3/uL (ref 150.0–400.0)
RBC: 4.13 Mil/uL (ref 3.87–5.11)
RDW: 13 % (ref 11.5–15.5)
WBC: 5.9 10*3/uL (ref 4.0–10.5)

## 2020-02-23 LAB — MAGNESIUM: Magnesium: 1.4 mg/dL — ABNORMAL LOW (ref 1.5–2.5)

## 2020-02-24 ENCOUNTER — Telehealth: Payer: Self-pay | Admitting: Internal Medicine

## 2020-02-24 NOTE — Telephone Encounter (Signed)
Patient informed and verbalized understanding.   She will increase the magnesium, med list updated.  She will increase water/fluid intake.  Patient is agreeable to starting a cholesterol medication but states she does not tolerate Statins well.  Please advise

## 2020-02-24 NOTE — Telephone Encounter (Signed)
Mailed patient a copy of these instructions and cholesterol handout per her request. Address verified with pt

## 2020-02-24 NOTE — Telephone Encounter (Signed)
-----   Message from Delorise Jackson, MD sent at 02/24/2020  7:38 AM EST ----- Magnesium slightly low  -she if she can increase magnesium otc to 400 mg daily   One of kidney #s slightly elevated but improved  Try to drink 50-55 ounces   Cholesterol worse  -is she agreeable to try pravastatin or lovastatin cholesterol medication  Blood cts normal

## 2020-02-25 ENCOUNTER — Telehealth: Payer: Self-pay

## 2020-02-25 NOTE — Telephone Encounter (Signed)
Pt called to request Dr. Georgeann Oppenheim advice. Pt wants to know how [place] she'll need to continue taking the Questran?

## 2020-02-27 NOTE — Telephone Encounter (Signed)
IF she has no diarrhea she can try and increase dietary fiber and go off questran to see if it works- if it doesn't is possibly a Friebel term medication

## 2020-02-28 ENCOUNTER — Other Ambulatory Visit: Payer: Self-pay | Admitting: Internal Medicine

## 2020-02-28 DIAGNOSIS — E785 Hyperlipidemia, unspecified: Secondary | ICD-10-CM

## 2020-02-28 MED ORDER — LOVASTATIN 10 MG PO TABS: 10.0000 mg | ORAL_TABLET | Freq: Every day | ORAL | 3 refills | Status: DC

## 2020-02-28 NOTE — Telephone Encounter (Signed)
Sent low dose mevacor 10 mg qhs  TMS

## 2020-02-28 NOTE — Telephone Encounter (Signed)
Spoke with pt and informed her of Dr. Georgeann Oppenheim recommendations. Pt understands and agrees. Pt states she's no longer experiencing the diarrhea at this time.

## 2020-03-14 ENCOUNTER — Telehealth: Payer: Self-pay | Admitting: Internal Medicine

## 2020-03-14 NOTE — Telephone Encounter (Signed)
Patient informed and verbalized understanding.  Med list and allergy list updated.  Pt will call back in if the suggestions do not help.

## 2020-03-14 NOTE — Telephone Encounter (Signed)
Patient started a new medication, lovastatin (MEVACOR) 10 MG tablet about week ago. Patient has red, hot, itchy skin on both arms around the elbow. She wants to know what she can do for this issue.

## 2020-03-14 NOTE — Telephone Encounter (Signed)
Please advise 

## 2020-03-14 NOTE — Telephone Encounter (Signed)
Stop the medication now  Try sarna lotion and hydrocortisone and vasoline  Take mevacor off med list and list as allergy   Otc antihistamine zyrtrec, claritin, or allegra for rash

## 2020-03-15 NOTE — Telephone Encounter (Signed)
Patient informed and verbalized understanding

## 2020-03-15 NOTE — Telephone Encounter (Signed)
Pt called wanting to get direction on how to take the Allergra because on the bottle it says ask Care Provider before taking if over the age of 74 and if you have Kidney Problems

## 2020-03-15 NOTE — Telephone Encounter (Signed)
Take 1/2 pill or 60 mg total at night   TMS

## 2020-03-15 NOTE — Telephone Encounter (Signed)
Please advise 

## 2020-03-22 ENCOUNTER — Other Ambulatory Visit: Payer: Self-pay

## 2020-03-22 ENCOUNTER — Ambulatory Visit (INDEPENDENT_AMBULATORY_CARE_PROVIDER_SITE_OTHER): Payer: Medicare Other | Admitting: Internal Medicine

## 2020-03-22 ENCOUNTER — Encounter: Payer: Self-pay | Admitting: Internal Medicine

## 2020-03-22 VITALS — BP 106/70 | HR 86 | Temp 97.6°F | Ht 63.5 in | Wt 144.4 lb

## 2020-03-22 DIAGNOSIS — J449 Chronic obstructive pulmonary disease, unspecified: Secondary | ICD-10-CM

## 2020-03-22 DIAGNOSIS — I10 Essential (primary) hypertension: Secondary | ICD-10-CM | POA: Diagnosis not present

## 2020-03-22 DIAGNOSIS — L309 Dermatitis, unspecified: Secondary | ICD-10-CM

## 2020-03-22 DIAGNOSIS — N1831 Chronic kidney disease, stage 3a: Secondary | ICD-10-CM | POA: Diagnosis not present

## 2020-03-22 DIAGNOSIS — Z1283 Encounter for screening for malignant neoplasm of skin: Secondary | ICD-10-CM

## 2020-03-22 MED ORDER — HYDROCORTISONE 2.5 % EX OINT: TOPICAL_OINTMENT | Freq: Two times a day (BID) | CUTANEOUS | 2 refills | Status: DC

## 2020-03-22 NOTE — Progress Notes (Signed)
Chief Complaint  Patient presents with  . Follow-up   F/u  1. HTN controlled on norvasc 5 mg qd  2. Wants skin check with dermatology  3. COPD with sob with exertion worse today on 3L with exertion and 2L at rest appt Dr. Raul Del 03/2020 consider pulmonary rehab in the future will discuss with him  4. HLD not able to tolerate statins recently had rash and itching to neck from mevacor use   Review of Systems  Constitutional: Negative for weight loss.  HENT: Negative for hearing loss.   Eyes: Negative for blurred vision.  Respiratory: Positive for shortness of breath.   Cardiovascular: Negative for chest pain.  Gastrointestinal: Negative for abdominal pain.  Musculoskeletal: Negative for back pain, falls and joint pain.  Skin: Positive for itching and rash.  Psychiatric/Behavioral: Negative for memory loss.   Past Medical History:  Diagnosis Date  . Anxiety   . Chicken pox   . COPD (chronic obstructive pulmonary disease) ([place])   . Essential tremor   . Hypertension   . Hypomagnesemia    Past Surgical History:  Procedure Laterality Date  . ABDOMINAL HYSTERECTOMY    . APPENDECTOMY    . CHOLECYSTECTOMY    . OVARIAN CYST REMOVAL    . SMALL INTESTINE SURGERY     Family History  Problem Relation Age of Onset  . Heart attack Mother   . Heart disease Mother   . Hypertension Mother   . Cancer Father        bladder smoker  . Heart disease Brother        MI in 43s   . Breast cancer Neg Hx    Social History   Socioeconomic History  . Marital status: Widowed    Spouse name: Not on file  . Number of children: Not on file  . Years of education: Not on file  . Highest education level: Not on file  Occupational History  . Not on file  Tobacco Use  . Smoking status: Former Research scientist (life sciences)  . Smokeless tobacco: Never Used  Substance and Sexual Activity  . Alcohol use: No  . Drug use: No  . Sexual activity: Not Currently  Other Topics Concern  . Not on file  Social History  Narrative   Has 4 children 3 girls and 1 boy   Former smoker    Lives in in [place] Strain:   . Difficulty of Paying Living Expenses:   Food Insecurity:   . Worried About Charity fundraiser in the Last Year:   . Arboriculturist in the Last Year:   Transportation Needs:   . Film/video editor (Medical):   Marland Kitchen Lack of Transportation (Non-Medical):   Physical Activity:   . Days of Exercise per Week:   . Minutes of Exercise per Session:   Stress:   . Feeling of Stress :   Social Connections:   . Frequency of Communication with Friends and Family:   . Frequency of Social Gatherings with Friends and Family:   . Attends Religious Services:   . Active Member of Clubs or Organizations:   . Attends Archivist Meetings:   Marland Kitchen Marital Status:   Intimate Partner Violence:   . Fear of Current or Ex-Partner:   . Emotionally Abused:   Marland Kitchen Physically Abused:   . Sexually Abused:    Current  Meds  Medication Sig  . acetaminophen (TYLENOL) 500 MG tablet Take 500 mg by mouth every 6 (six) hours as needed.  Marland Kitchen albuterol (VENTOLIN HFA) 108 (90 Base) MCG/ACT inhaler Inhale 1-2 puffs into the lungs every 6 (six) hours as needed for wheezing or shortness of breath.  Marland Kitchen amLODipine (NORVASC) 5 MG tablet Take 1 tablet (5 mg total) by mouth daily.  . budesonide-formoterol (SYMBICORT) 160-4.5 MCG/ACT inhaler Inhale 2 puffs into the lungs 2 (two) times daily.  . calcium carbonate (OSCAL) 1500 (600 Ca) MG TABS tablet Take 1,500 mg by mouth 2 (two) times daily with a meal.   . Cholecalciferol (VITAMIN D-1000 MAX ST) 1000 units tablet Take 1,000 Units by mouth daily.   . diclofenac sodium (VOLTAREN) 1 % GEL Apply 2-4 g topically 4 (four) times daily. Prn 2 g upper body and 4 g lower body  . LORazepam (ATIVAN) 1 MG tablet Take 1 mg by mouth 2 (two) times daily.   . Magnesium 400 MG TABS Take by mouth daily.  .  ondansetron (ZOFRAN) 4 MG tablet Take 1 tablet (4 mg total) by mouth every 8 (eight) hours as needed for nausea or vomiting.  . potassium chloride SA (K-DUR) 20 MEQ tablet Take 1 tablet (20 mEq total) by mouth daily.  Marland Kitchen tiotropium (SPIRIVA) 18 MCG inhalation capsule Place 18 mcg into inhaler and inhale daily.   Allergies  Allergen Reactions  . Trelegy Ellipta [Fluticasone-Umeclidin-Vilant]     Shortness of breath  . Codeine Nausea And Vomiting  . Lipitor [Atorvastatin] Nausea And Vomiting  . Omeprazole     unknown  . Vicodin [Hydrocodone-Acetaminophen]     unknown  . Zyban [Bupropion]     unknown  . Mevacor [Lovastatin] Itching and Rash   Recent Results (from the past 2160 hour(s))  GI Profile, Stool, PCR     Status: None   Collection Time: 01/27/20 12:00 AM  Result Value Ref Range   Campylobacter Not Detected Not Detected   C difficile toxin A/B Not Detected Not Detected   Plesiomonas shigelloides Not Detected Not Detected   Salmonella Not Detected Not Detected   Vibrio Not Detected Not Detected   Vibrio cholerae Not Detected Not Detected   Yersinia enterocolitica Not Detected Not Detected   Enteroaggregative E coli Not Detected Not Detected   Enteropathogenic E coli Not Detected Not Detected   Enterotoxigenic E coli Not Detected Not Detected   Shiga-toxin-producing E coli Not Detected Not Detected   E coli [id number] Not applicable Not Detected   Shigella/Enteroinvasive E coli Not Detected Not Detected   Cryptosporidium Not Detected Not Detected   Cyclospora cayetanensis Not Detected Not Detected   Entamoeba histolytica Not Detected Not Detected   Giardia lamblia Not Detected Not Detected   Adenovirus F 40/41 Not Detected Not Detected   Astrovirus Not Detected Not Detected   Norovirus GI/GII Not Detected Not Detected   Rotavirus A Not Detected Not Detected   Sapovirus Not Detected Not Detected  Calprotectin, Fecal     Status: None   Collection Time: 01/27/20 12:00 AM    Specimen: Stool  Result Value Ref Range   Calprotectin, Fecal 29 0 - 120 ug/g    Comment: Concentration     Interpretation   Follow-Up <16 - 50 ug/g     Normal           None >50 -120 ug/g     Borderline       Re-evaluate in 4-6 weeks     >  120 ug/g     Abnormal         Repeat as clinically                                    indicated   C difficile Toxins A+B W/Rflx     Status: None   Collection Time: 01/27/20 12:00 AM   STOOL  Result Value Ref Range   C difficile Toxins A+B, EIA Negative Negative  C difficile, Cytotoxin B     Status: None   Collection Time: 01/27/20 12:00 AM   STOOL  Result Value Ref Range   C diff Toxin B Final report     Comment:                             Reference Range: Negative   Result 1 Comment     Comment: Negative No cytotoxin detected.   Magnesium     Status: Abnormal   Collection Time: 02/23/20 10:35 AM  Result Value Ref Range   Magnesium 1.4 (L) 1.5 - 2.5 mg/dL  CBC with Differential/Platelet     Status: None   Collection Time: 02/23/20 10:35 AM  Result Value Ref Range   WBC 5.9 4.0 - 10.5 K/uL   RBC 4.13 3.87 - 5.11 Mil/uL   Hemoglobin 12.6 12.0 - 15.0 g/dL   HCT 37.8 36.0 - 46.0 %   MCV 91.5 78.0 - 100.0 fl   MCHC 33.2 30.0 - 36.0 g/dL   RDW 13.0 11.5 - 15.5 %   Platelets 292.0 150.0 - 400.0 K/uL   Neutrophils Relative % 66.4 43.0 - 77.0 %   Lymphocytes Relative 23.6 12.0 - 46.0 %   Monocytes Relative 7.0 3.0 - 12.0 %   Eosinophils Relative 1.9 0.0 - 5.0 %   Basophils Relative 1.1 0.0 - 3.0 %   Neutro Abs 3.9 1.4 - 7.7 K/uL   Lymphs Abs 1.4 0.7 - 4.0 K/uL   Monocytes Absolute 0.4 0.1 - 1.0 K/uL   Eosinophils Absolute 0.1 0.0 - 0.7 K/uL   Basophils Absolute 0.1 0.0 - 0.1 K/uL  Comprehensive metabolic panel     Status: Abnormal   Collection Time: 02/23/20 10:35 AM  Result Value Ref Range   Sodium 139 135 - 145 mEq/L   Potassium 3.6 3.5 - 5.1 mEq/L   Chloride 101 96 - 112 mEq/L   CO2 33 (H) 19 - 32 mEq/L   Glucose, Bld 109 (H) 70 -  99 mg/dL   BUN 27 (H) 6 - 23 mg/dL   Creatinine, Ser 0.91 0.40 - 1.20 mg/dL   Total Bilirubin 1.0 0.2 - 1.2 mg/dL   Alkaline Phosphatase 52 39 - 117 U/L   AST 15 0 - 37 U/L   ALT 10 0 - 35 U/L   Total Protein 6.6 6.0 - 8.3 g/dL   Albumin 4.0 3.5 - 5.2 g/dL   GFR 58.86 (L) >60.00 mL/min   Calcium 9.5 8.4 - 10.5 mg/dL  Lipid panel     Status: Abnormal   Collection Time: 02/23/20 10:35 AM  Result Value Ref Range   Cholesterol 238 (H) 0 - 200 mg/dL    Comment: ATP III Classification       Desirable:  < 200 mg/dL               Borderline High:  200 - 239  mg/dL          High:  > = 240 mg/dL   Triglycerides 141.0 0.0 - 149.0 mg/dL    Comment: Normal:  <150 mg/dLBorderline High:  150 - 199 mg/dL   HDL 86.00 >39.00 mg/dL   VLDL 28.2 0.0 - 40.0 mg/dL   LDL Cholesterol 124 (H) 0 - 99 mg/dL   Total CHOL/HDL Ratio 3     Comment:                Men          Women1/2 Average Risk     3.4          3.3Average Risk          5.0          4.42X Average Risk          9.6          7.13X Average Risk          15.0          11.0                       NonHDL 151.72     Comment: NOTE:  Non-HDL goal should be 30 mg/dL higher than patient's LDL goal (i.e. LDL goal of < 70 mg/dL, would have non-HDL goal of < 100 mg/dL)   Objective  Body mass index is 25.18 kg/m. Wt Readings from Last 3 Encounters:  03/22/20 144 lb 6.4 oz (65.5 kg)  02/04/20 144 lb (65.3 kg)  01/18/20 144 lb 8 oz (65.5 kg)   Temp Readings from Last 3 Encounters:  03/22/20 97.6 F (36.4 C) (Temporal)  01/18/20 98 F (36.7 C) (Oral)  08/03/19 98 F (36.7 C) (Temporal)   BP Readings from Last 3 Encounters:  03/22/20 106/70  01/18/20 (!) 159/84  08/03/19 120/60   Pulse Readings from Last 3 Encounters:  03/22/20 86  01/18/20 (!) 108  08/03/19 83    Physical Exam Vitals and nursing note reviewed.  Constitutional:      Appearance: Normal appearance. She is well-developed and well-groomed.  HENT:     Head: Normocephalic and  atraumatic.  Eyes:     Conjunctiva/sclera: Conjunctivae normal.     Pupils: Pupils are equal, round, and reactive to light.  Cardiovascular:     Rate and Rhythm: Normal rate and regular rhythm.     Heart sounds: Normal heart sounds. No murmur.  Pulmonary:     Effort: Pulmonary effort is normal.     Breath sounds: Normal breath sounds.  Skin:    General: Skin is warm and dry.     Comments: Xerosis to skin diffuse Bruising arms and legs   Neurological:     General: No focal deficit present.     Mental Status: She is alert and oriented to person, place, and time. Mental status is at baseline.     Comments: Bl walks with cane   Psychiatric:        Attention and Perception: Attention and perception normal.        Mood and Affect: Mood and affect normal.        Speech: Speech normal.        Behavior: Behavior normal. Behavior is cooperative.        Thought Content: Thought content normal.        Cognition and Memory: Cognition and memory normal.        Judgment: Judgment normal.  Assessment  Plan  Essential hypertension - Plan: Basic Metabolic Panel (BMET) Cont norvasc 5 mg qd   Eczema, unspecified type - Plan: hydrocortisone 2.5 % ointment  Stage 3a chronic kidney disease - Plan: Basic Metabolic Panel (BMET)  Chronic obstructive pulmonary disease, unspecified COPD type ([place])  See if pulm agrees to pulm rehab appt 03/2020 Dr. Raul Del  Cont meds    Diarrhea resolved with increased fiber I.e oatmeal Stop Questran 4 g prn for now   HM Flu 10/06/19 with pulm clinic Dr. Raul Del Check prior vaccines [facility] (per pt had 10/04/18 pna shot and flu)  01/18/20 moderna sch 2/2   Tdap 02/09/16  Out of age window pap, colonoscopy mammo 01/28/19 negativeordered mammogram [place] due 01/29/20  Consider DEXAif has not hadordered dexa can have with mammogram  -call to schedule  Former smoker   GI Dr. Tiffany Kocher  Lung Dr. Rosita Kea Dr. Michaelene Song AE  Fasting labs spring  02/2020  Provider: Dr. Olivia Mackie McLean-Scocuzza-Internal Medicine

## 2020-03-22 NOTE — Patient Instructions (Addendum)
Cerave cream  Or vasoline after showering   Consider Pulmonary rehab   Use a back scratcher to apply creams   Dr. Kellie Moor dermatology    Seborrheic Keratosis A seborrheic keratosis is a common, noncancerous (benign) skin growth. These growths are velvety, waxy, rough, tan, brown, or black spots that appear on the skin. These skin growths can be flat or raised, and scaly. What are the causes? The cause of this condition is not known. What increases the risk? You are more likely to develop this condition if you:  Have a family history of seborrheic keratosis.  Are 50 or older.  Are pregnant.  Have had estrogen replacement therapy. What are the signs or symptoms? Symptoms of this condition include growths on the face, chest, shoulders, back, or other areas. These growths:  Are usually painless, but may become irritated and itchy.  Can be yellow, brown, black, or other colors.  Are slightly raised or have a flat surface.  Are sometimes rough or wart-like in texture.  Are often velvety or waxy on the surface.  Are round or oval-shaped.  Often occur in groups, but may occur as a single growth. How is this diagnosed? This condition is diagnosed with a medical history and physical exam.  A sample of the growth may be tested (skin biopsy).  You may need to see a skin specialist (dermatologist). How is this treated? Treatment is not usually needed for this condition, unless the growths are irritated or bleed often.  You may also choose to have the growths removed if you do not like their appearance. ? Most commonly, these growths are treated with a procedure in which liquid nitrogen is applied to "freeze" off the growth (cryosurgery). ? They may also be burned off with electricity (electrocautery) or removed by scraping (curettage). Follow these instructions at home:  Watch your growth for any changes.  Keep all follow-up visits as told by your health care provider.  This is important.  Do not scratch or pick at the growth or growths. This can cause them to become irritated or infected. Contact a health care provider if:  You suddenly have many new growths.  Your growth bleeds, itches, or hurts.  Your growth suddenly becomes larger or changes color. Summary  A seborrheic keratosis is a common, noncancerous (benign) skin growth.  Treatment is not usually needed for this condition, unless the growths are irritated or bleed often.  Watch your growth for any changes.  Contact a health care provider if you suddenly have many new growths or your growth suddenly becomes larger or changes color.  Keep all follow-up visits as told by your health care provider. This is important. This information is not intended to replace advice given to you by your health care provider. Make sure you discuss any questions you have with your health care provider. Document Revised: 04/16/2018 Document Reviewed: 04/16/2018 Elsevier Patient Education  [place].  Eczema Eczema is a broad term for a group of skin conditions that cause skin to become rough and inflamed. Each type of eczema has different triggers, symptoms, and treatments. Eczema of any type is usually itchy and symptoms range from mild to severe. Eczema and its symptoms are not spread from person to person (are not contagious). It can appear on different parts of the body at different times. Your eczema may not look the same as someone else's eczema. What are the types of eczema? Atopic dermatitis This is a [place]-term (chronic) skin disease that  keeps coming back (recurring). Usual symptoms are dry skin and small, solid pimples that may swell and leak fluid (weep). Contact dermatitis  This happens when something irritates the skin and causes a rash. The irritation can come from substances that you are allergic to (allergens), such as poison ivy, chemicals, or medicines that were applied to your  skin. Dyshidrotic eczema This is a form of eczema on the hands and feet. It shows up as very itchy, fluid-filled blisters. It can affect people of any age, but is more common before age 80. Hand eczema  This causes very itchy areas of skin on the palms and sides of the hands and fingers. This type of eczema is common in industrial jobs where you may be exposed to many different types of irritants. Lichen simplex chronicus This type of eczema occurs when a person constantly scratches one area of the body. Repeated scratching of the area leads to thickened skin (lichenification). Lichen simplex chronicus can occur along with other types of eczema. It is more common in adults, but may be seen in children as well. Nummular eczema This is a common type of eczema. It has no known cause. It typically causes a red, circular, crusty lesion (plaque) that may be itchy. Scratching may become a habit and can cause bleeding. Nummular eczema occurs most often in people of middle-age or older. It most often affects the hands. Seborrheic dermatitis This is a common skin disease that mainly affects the scalp. It may also affect any oily areas of the body, such as the face, sides of nose, eyebrows, ears, eyelids, and chest. It is marked by small scaling and redness of the skin (erythema). This can affect people of all ages. In infants, this condition is known as Chartered certified accountant." Stasis dermatitis This is a common skin disease that usually appears on the legs and feet. It most often occurs in people who have a condition that prevents blood from being pumped through the veins in the legs (chronic venous insufficiency). Stasis dermatitis is a chronic condition that needs [place]-term management. How is eczema diagnosed? Your health care provider will examine your skin and review your medical history. He or she may also give you skin patch tests. These tests involve taking patches that contain possible allergens and placing them  on your back. He or she will then check in a few days to see if an allergic reaction occurred. What are the common treatments? Treatment for eczema is based on the type of eczema you have. Hydrocortisone steroid medicine can relieve itching quickly and help reduce inflammation. This medicine may be prescribed or obtained over-the-counter, depending on the strength of the medicine that is needed. Follow these instructions at home:  Take over-the-counter and prescription medicines only as told by your health care provider.  Use creams or ointments to moisturize your skin. Do not use lotions.  Learn what triggers or irritates your symptoms. Avoid these things.  Treat symptom flare-ups quickly.  Do not itch your skin. This can make your rash worse.  Keep all follow-up visits as told by your health care provider. This is important. Where to find more information  The American Academy of Dermatology: http://jones-macias.info/  The National Eczema Association: www.nationaleczema.org Contact a health care provider if:  You have serious itching, even with treatment.  You regularly scratch your skin until it bleeds.  Your rash looks different than usual.  Your skin is painful, swollen, or more red than usual.  You have a fever.  Summary  There are eight general types of eczema. Each type has different triggers.  Eczema of any type causes itching that may range from mild to severe.  Treatment varies based on the type of eczema you have. Hydrocortisone steroid medicine can help with itching and inflammation.  Protecting your skin is the best way to prevent eczema. Use moisturizers and lotions. Avoid triggers and irritants, and treat flare-ups quickly. This information is not intended to replace advice given to you by your health care provider. Make sure you discuss any questions you have with your health care provider. Document Revised: 11/14/2017 Document Reviewed: 04/17/2017 Elsevier Patient  Education  [place].

## 2020-03-24 DIAGNOSIS — J449 Chronic obstructive pulmonary disease, unspecified: Secondary | ICD-10-CM | POA: Diagnosis not present

## 2020-03-30 ENCOUNTER — Telehealth: Payer: Self-pay | Admitting: Internal Medicine

## 2020-03-30 NOTE — Telephone Encounter (Signed)
I called Raquel Sarna at [facility] regarding referral.

## 2020-04-11 ENCOUNTER — Other Ambulatory Visit: Payer: Self-pay

## 2020-04-11 ENCOUNTER — Encounter: Payer: Self-pay | Admitting: Internal Medicine

## 2020-04-11 ENCOUNTER — Ambulatory Visit (INDEPENDENT_AMBULATORY_CARE_PROVIDER_SITE_OTHER): Payer: Medicare Other | Admitting: Internal Medicine

## 2020-04-11 VITALS — Ht 63.5 in | Wt 142.0 lb

## 2020-04-11 DIAGNOSIS — L259 Unspecified contact dermatitis, unspecified cause: Secondary | ICD-10-CM

## 2020-04-11 DIAGNOSIS — L509 Urticaria, unspecified: Secondary | ICD-10-CM

## 2020-04-11 MED ORDER — PREDNISONE 20 MG PO TABS: 40.0000 mg | ORAL_TABLET | Freq: Every day | ORAL | 0 refills | Status: DC

## 2020-04-11 MED ORDER — HYDROXYZINE HCL 25 MG PO TABS: 25.0000 mg | ORAL_TABLET | Freq: Two times a day (BID) | ORAL | 2 refills | Status: DC | PRN

## 2020-04-11 NOTE — Progress Notes (Signed)
Telephone Note  I connected with Kristen Bailey  on 04/11/20 at 11:47 AM EDT by telephone and verified that I am speaking with the correct person using two identifiers.  Location patient: home Location provider:work or home office Persons participating in the virtual visit: patient, provider  I discussed the limitations of evaluation and management by telemedicine and the availability of in person appointments. The patient expressed understanding and agreed to proceed.   HPI: C/o itchy red rash since last visit moving to different locations on hands, arms, fingers, neck no exposure to poison ivy/oak. She uses dove soap normally daughter washes her clothes but another person washed her clothes for her recently. She tried HC 2.5 % oint w/o relief and allegra itching worse at night   ROS: See pertinent positives and negatives per HPI.  Past Medical History:  Diagnosis Date  . Anxiety   . Chicken pox   . COPD (chronic obstructive pulmonary disease) ([place])   . Essential tremor   . Hypertension   . Hypomagnesemia     Past Surgical History:  Procedure Laterality Date  . ABDOMINAL HYSTERECTOMY    . APPENDECTOMY    . CHOLECYSTECTOMY    . OVARIAN CYST REMOVAL    . SMALL INTESTINE SURGERY      Family History  Problem Relation Age of Onset  . Heart attack Mother   . Heart disease Mother   . Hypertension Mother   . Cancer Father        bladder smoker  . Heart disease Brother        MI in 26s   . Breast cancer Neg Hx     SOCIAL HX: lives alone   Current Outpatient Medications:  .  acetaminophen (TYLENOL) 500 MG tablet, Take 500 mg by mouth every 6 (six) hours as needed., Disp: , Rfl:  .  albuterol (VENTOLIN HFA) 108 (90 Base) MCG/ACT inhaler, Inhale 1-2 puffs into the lungs every 6 (six) hours as needed for wheezing or shortness of breath., Disp: , Rfl:  .  amLODipine (NORVASC) 5 MG tablet, Take 1 tablet (5 mg total) by mouth daily., Disp: 90 tablet, Rfl: 3 .   budesonide-formoterol (SYMBICORT) 160-4.5 MCG/ACT inhaler, Inhale 2 puffs into the lungs 2 (two) times daily., Disp: , Rfl:  .  calcium carbonate (OSCAL) 1500 (600 Ca) MG TABS tablet, Take 1,500 mg by mouth 2 (two) times daily with a meal. , Disp: , Rfl:  .  Cholecalciferol (VITAMIN D-1000 MAX ST) 1000 units tablet, Take 1,000 Units by mouth daily. , Disp: , Rfl:  .  diclofenac sodium (VOLTAREN) 1 % GEL, Apply 2-4 g topically 4 (four) times daily. Prn 2 g upper body and 4 g lower body, Disp: 100 g, Rfl: 11 .  hydrocortisone 2.5 % ointment, Apply topically 2 (two) times daily. As needed to skin, Disp: 60 g, Rfl: 2 .  hydrOXYzine (ATARAX/VISTARIL) 25 MG tablet, Take 1 tablet (25 mg total) by mouth 2 (two) times daily as needed. Try at night first. Please deliver. Stop allegra, Disp: 60 tablet, Rfl: 2 .  lisinopril-hydrochlorothiazide (ZESTORETIC) 10-12.5 MG tablet, Take 1 tablet by mouth daily. , Disp: , Rfl:  .  LORazepam (ATIVAN) 1 MG tablet, Take 1 mg by mouth 2 (two) times daily. , Disp: , Rfl:  .  Magnesium 400 MG TABS, Take by mouth daily., Disp: , Rfl:  .  ondansetron (ZOFRAN) 4 MG tablet, Take 1 tablet (4 mg total) by mouth every 8 (eight) hours as needed  for nausea or vomiting., Disp: 90 tablet, Rfl: 0 .  potassium chloride SA (K-DUR) 20 MEQ tablet, Take 1 tablet (20 mEq total) by mouth daily., Disp: 90 tablet, Rfl: 3 .  predniSONE (DELTASONE) 20 MG tablet, Take 2 tablets (40 mg total) by mouth daily with breakfast. Please deliver, Disp: 14 tablet, Rfl: 0 .  Probiotic Product (ALIGN PO), Take by mouth daily., Disp: , Rfl:  .  tiotropium (SPIRIVA) 18 MCG inhalation capsule, Place 18 mcg into inhaler and inhale daily., Disp: , Rfl:   EXAM:  VITALS per patient if applicable:  GENERAL: alert, oriented, appears well and in no acute distress  PSYCH/NEURO: pleasant and cooperative, no obvious depression or anxiety, speech and thought processing grossly intact  ASSESSMENT AND PLAN:  Discussed  the following assessment and plan:  Hives vs contact derm- Plan: predniSONE (DELTASONE) 40 MG tablet x 1 week, hydrOXYzine (ATARAX/VISTARIL) 25 MG tablet bid prn Gold pend Pending derm referral appt sch 06/26/20 Dr. Raliegh Ip awaiting cancellation   Contact dermatitis, unspecified contact dermatitis type, unspecified trigger  -we discussed possible serious and likely etiologies, options for evaluation and workup, limitations of telemedicine visit vs in person visit, treatment, treatment risks and precautions. Pt prefers to treat via telemedicine empirically rather then risking or undertaking an in person visit at this moment. Patient agrees to seek prompt in person care if worsening, new symptoms arise, or if is not improving with treatment.   I discussed the assessment and treatment plan with the patient. The patient was provided an opportunity to ask questions and all were answered. The patient agreed with the plan and demonstrated an understanding of the instructions.   The patient was advised to call back or seek an in-person evaluation if the symptoms worsen or if the condition fails to improve as anticipated.  Time spent 20 minutes Delorise Jackson, MD

## 2020-04-11 NOTE — Patient Instructions (Signed)
Hives Hives (urticaria) are itchy, red, swollen areas on the skin. Hives can appear on any part of the body. Hives often fade within 24 hours (acute hives). Sometimes, new hives appear after old ones fade and the cycle can continue for several days or weeks (chronic hives). Hives do not spread from person to person (are not contagious). Hives come from the body's reaction to something a person is allergic to (allergen), something that causes irritation, or various other triggers. When a person is exposed to a trigger, his or her body releases a chemical (histamine) that causes redness, itching, and swelling. Hives can appear right after exposure to a trigger or hours later. What are the causes? This condition may be caused by:  Allergies to foods or ingredients.  Insect bites or stings.  Exposure to pollen or pets.  Contact with latex or chemicals.  Spending time in sunlight, heat, or cold (exposure).  Exercise.  Stress.  Certain medicines. You can also get hives from other medical conditions and treatments, such as:  Viruses, including the common cold.  Bacterial infections, such as urinary tract infections and strep throat.  Certain medicines.  Allergy shots.  Blood transfusions. Sometimes, the cause of this condition is not known (idiopathic hives). What increases the risk? You are more likely to develop this condition if you:  Are a woman.  Have food allergies, especially to citrus fruits, milk, eggs, peanuts, tree nuts, or shellfish.  Are allergic to: ? Medicines. ? Latex. ? Insects. ? Animals. ? Pollen. What are the signs or symptoms? Common symptoms of this condition include raised, itchy, red or white bumps or patches on your skin. These areas may:  Become large and swollen (welts).  Change in shape and location, quickly and repeatedly.  Be separate hives or connect over a large area of skin.  Sting or become painful.  Turn white when pressed in the  center (blanch). In severe cases, yourhands, feet, and face may also become swollen. This may occur if hives develop deeper in your skin. How is this diagnosed? This condition may be diagnosed by your symptoms, medical history, and physical exam.  Your skin, urine, or blood may be tested to find out what is causing your hives and to rule out other health issues.  Your health care provider may also remove a small sample of skin from the affected area and examine it under a microscope (biopsy). How is this treated? Treatment for this condition depends on the cause and severity of your symptoms. Your health care provider may recommend using cool, wet cloths (cool compresses) or taking cool showers to relieve itching. Treatment may include:  Medicines that help: ? Relieve itching (antihistamines). ? Reduce swelling (corticosteroids). ? Treat infection (antibiotics).  An injectable medicine (omalizumab). Your health care provider may prescribe this if you have chronic idiopathic hives and you continue to have symptoms even after treatment with antihistamines. Severe cases may require an emergency injection of adrenaline (epinephrine) to prevent a life-threatening allergic reaction (anaphylaxis). Follow these instructions at home: Medicines  Take and apply over-the-counter and prescription medicines only as told by your health care provider.  If you were prescribed an antibiotic medicine, take it as told by your health care provider. Do not stop using the antibiotic even if you start to feel better. Skin care  Apply cool compresses to the affected areas.  Do not scratch or rub your skin. General instructions  Do not take hot showers or baths. This can make itching   worse.  Do not wear tight-fitting clothing.  Use sunscreen and wear protective clothing when you are outside.  Avoid any substances that cause your hives. Keep a journal to help track what causes your hives. Write  down: ? What medicines you take. ? What you eat and drink. ? What products you use on your skin.  Keep all follow-up visits as told by your health care provider. This is important. Contact a health care provider if:  Your symptoms are not controlled with medicine.  Your joints are painful or swollen. Get help right away if:  You have a fever.  You have pain in your abdomen.  Your tongue or lips are swollen.  Your eyelids are swollen.  Your chest or throat feels tight.  You have trouble breathing or swallowing. These symptoms may represent a serious problem that is an emergency. Do not wait to see if the symptoms will go away. Get medical help right away. Call your local emergency services (911 in the [place] Do not drive yourself to the hospital. Summary  Hives (urticaria) are itchy, red, swollen areas on your skin. Hives come from the body's reaction to something a person is allergic to (allergen), something that causes irritation, or various other triggers.  Treatment for this condition depends on the cause and severity of your symptoms.  Avoid any substances that cause your hives. Keep a journal to help track what causes your hives.  Take and apply over-the-counter and prescription medicines only as told by your health care provider.  Keep all follow-up visits as told by your health care provider. This is important. This information is not intended to replace advice given to you by your health care provider. Make sure you discuss any questions you have with your health care provider. Document Revised: 06/17/2018 Document Reviewed: 06/17/2018 Elsevier Patient Education  2020 Elsevier Inc.  

## 2020-04-12 ENCOUNTER — Other Ambulatory Visit: Payer: Medicare Other

## 2020-04-14 ENCOUNTER — Telehealth: Payer: Self-pay | Admitting: Internal Medicine

## 2020-04-14 NOTE — Telephone Encounter (Signed)
Faxed

## 2020-04-14 NOTE — Telephone Encounter (Signed)
-----   Message from Delorise Jackson, MD sent at 03/22/2020  2:18 PM EDT ----- Fax note Dr. Raul Del appt 03/2020 would he consider pulm rehab?

## 2020-04-20 ENCOUNTER — Telehealth: Payer: Self-pay | Admitting: Internal Medicine

## 2020-04-20 NOTE — Telephone Encounter (Signed)
She does not need to see me needs to see derm for hives Please call Dr. Kellie Moor or [facility] and see if can see her asap

## 2020-04-20 NOTE — Telephone Encounter (Signed)
Pt seen virtually 04/11/20. Please advise

## 2020-04-20 NOTE — Telephone Encounter (Signed)
Pt states that she finished the prednisone on Monday but her rash/hives is not any better. Please call back to advise

## 2020-04-20 NOTE — Telephone Encounter (Signed)
Patient informed and verbalized understanding. She will await a call with an updated appointment.

## 2020-04-23 DIAGNOSIS — J449 Chronic obstructive pulmonary disease, unspecified: Secondary | ICD-10-CM | POA: Diagnosis not present

## 2020-04-28 ENCOUNTER — Telehealth: Payer: Self-pay | Admitting: Internal Medicine

## 2020-04-28 NOTE — Telephone Encounter (Signed)
Per Claiborne Billings at [facility] states no sooner appt avail. They are also down 1 doc. Pt is on a cancellation list.

## 2020-04-28 NOTE — Telephone Encounter (Signed)
Please advise, would you like to refer Patient somewhere else or for her to be seen by [place]?

## 2020-04-28 NOTE — Telephone Encounter (Signed)
Inform pt no derm available until August would call who she is currently scheduled with for cancellations daily   MTS

## 2020-04-28 NOTE — Telephone Encounter (Signed)
I left vm on [facility] and [facility] for a sooner appt.

## 2020-04-28 NOTE — Telephone Encounter (Signed)
I left vm on [facility] vm for a sooner appt for pt.

## 2020-04-28 NOTE — Telephone Encounter (Signed)
Can [facility] see sooner or on [address]?   Thanks [employer]

## 2020-04-28 NOTE — Telephone Encounter (Signed)
[facility] called no new pt appts til October.

## 2020-04-28 NOTE — Telephone Encounter (Signed)
Patient informed and verbalized understanding.  She will call to check cancellations.

## 2020-04-28 NOTE — Telephone Encounter (Signed)
I called [facility] np appt not avail until August.

## 2020-05-29 ENCOUNTER — Ambulatory Visit: Payer: Medicare Other | Admitting: Dermatology

## 2020-05-29 ENCOUNTER — Other Ambulatory Visit: Payer: Self-pay

## 2020-05-29 DIAGNOSIS — D692 Other nonthrombocytopenic purpura: Secondary | ICD-10-CM

## 2020-05-29 DIAGNOSIS — Z1283 Encounter for screening for malignant neoplasm of skin: Secondary | ICD-10-CM

## 2020-05-29 DIAGNOSIS — L709 Acne, unspecified: Secondary | ICD-10-CM

## 2020-05-29 DIAGNOSIS — L578 Other skin changes due to chronic exposure to nonionizing radiation: Secondary | ICD-10-CM

## 2020-05-29 DIAGNOSIS — L509 Urticaria, unspecified: Secondary | ICD-10-CM | POA: Diagnosis not present

## 2020-05-29 DIAGNOSIS — D3610 Benign neoplasm of peripheral nerves and autonomic nervous system, unspecified: Secondary | ICD-10-CM

## 2020-05-29 DIAGNOSIS — L570 Actinic keratosis: Secondary | ICD-10-CM | POA: Diagnosis not present

## 2020-05-29 DIAGNOSIS — D229 Melanocytic nevi, unspecified: Secondary | ICD-10-CM

## 2020-05-29 DIAGNOSIS — L821 Other seborrheic keratosis: Secondary | ICD-10-CM

## 2020-05-29 DIAGNOSIS — L814 Other melanin hyperpigmentation: Secondary | ICD-10-CM

## 2020-05-29 DIAGNOSIS — D1801 Hemangioma of skin and subcutaneous tissue: Secondary | ICD-10-CM

## 2020-05-29 MED ORDER — MONTELUKAST SODIUM 10 MG PO TABS: 10.0000 mg | ORAL_TABLET | Freq: Every day | ORAL | 1 refills | Status: DC

## 2020-05-29 NOTE — Patient Instructions (Addendum)
Recommend daily broad spectrum sunscreen SPF 30+ to sun-exposed areas, reapply every 2 hours as needed. Call for new or changing lesions.  Prior to procedure, discussed risks of blister formation, small wound, skin dyspigmentation, or rare scar following cryotherapy.  Cryotherapy Aftercare  . Wash gently with soap and water everyday.   Marland Kitchen Apply Vaseline and Band-Aid daily until healed.  HIVES: Take Hydroxyzine 25 mg at bedtime Take Singulair 10mg  Once daily Take Allegra 180mg   In Morning

## 2020-05-29 NOTE — Progress Notes (Signed)
New Patient Visit  Subjective  Kristen Bailey is a 86 y.o. female who presents for the following: New Patient (Initial Visit).  Patient presents today as a New Patient, wants a TBSE, has a rash for past 2 months with lots of white spots that itch a lot and keep her up at night using an OTC moisturizer, also has a small area on her left shoulder.  The patient presents for Total-Body Skin Exam (TBSE) for skin cancer screening and mole check.  The following portions of the chart were reviewed this encounter and updated as appropriate:  Tobacco  Allergies  Meds  Problems  Med Hx  Surg Hx  Fam Hx      Review of Systems:  No other skin or systemic complaints except as noted in HPI or Assessment and Plan.  Objective  Well appearing patient in no apparent distress; mood and affect are within normal limits.  A full examination was performed including scalp, head, eyes, ears, nose, lips, neck, chest, axillae, abdomen, back, buttocks, bilateral upper extremities, bilateral lower extremities, hands, feet, fingers, toes, fingernails, and toenails. All findings within normal limits unless otherwise noted below.  Objective  Neck - Anterior: Flesh color papule  Objective  Left Forehead: Erythematous thin papules/macules with gritty scale.   Objective  Back, Chest, B/L arms and legs.: Uritcaria papules on back and chest  Objective  Left forehead: Pink papule   Assessment & Plan    Neurofibroma Neck - Anterior  Benign, observe.    AK (actinic keratosis) Left Forehead  Cryotherapy today  Prior to procedure, discussed risks of blister formation, small wound, skin dyspigmentation, or rare scar following cryotherapy.    Destruction of lesion - Left Forehead Complexity: simple   Destruction method: cryotherapy   Informed consent: discussed and consent obtained   Timeout:  patient name, date of birth, surgical site, and procedure verified Lesion destroyed using  liquid nitrogen: Yes   Region frozen until ice ball extended beyond lesion: Yes   Outcome: patient tolerated procedure well with no complications   Post-procedure details: wound care instructions given    Urticaria Back, Chest, B/L arms and legs.  Continue taking Hydroxyzine 25 mg at night Start Allegra 180 mg in the morning Take Singulair 10 mg once daily.  May consider Xolair if not improving on follow up   montelukast (SINGULAIR) 10 MG tablet - Back, Chest, B/L arms and legs.  Acne, unspecified acne type Left forehead   Benign, Will recheck on f/u  Skin cancer screening   Lentigines - Scattered tan macules - Discussed due to sun exposure - Benign, observe - Call for any changes  Seborrheic Keratoses - Stuck-on, waxy, tan-brown papules and plaques  - Discussed benign etiology and prognosis. - Observe - Call for any changes  Melanocytic Nevi - Tan-brown and/or pink-flesh-colored symmetric macules and papules - Benign appearing on exam today - Observation - Call clinic for new or changing moles - Recommend daily use of broad spectrum spf 30+ sunscreen to sun-exposed areas.   Hemangiomas - Red papules - Discussed benign nature - Observe - Call for any changes  Actinic Damage - diffuse scaly erythematous macules with underlying dyspigmentation - Recommend daily broad spectrum sunscreen SPF 30+ to sun-exposed areas, reapply every 2 hours as needed.  - Call for new or changing lesions.  Purpura - Violaceous macules and patches - Benign - Related to age, sun damage and/or use of blood thinners - Observe - Can use OTC arnica containing  moisturizer such as Dermend Bruise Formula if desired - Call for worsening or other concerns  Skin cancer screening performed today.  Return for 3 to 4 weeks Uticaria.  Marene Lenz, CMA, am acting as scribe for Sarina Ser, MD . Documentation: I have reviewed the above documentation for accuracy and completeness,  and I agree with the above.  Sarina Ser, MD

## 2020-06-01 ENCOUNTER — Encounter: Payer: Self-pay | Admitting: Dermatology

## 2020-06-22 DIAGNOSIS — Z9981 Dependence on supplemental oxygen: Secondary | ICD-10-CM | POA: Diagnosis not present

## 2020-06-22 DIAGNOSIS — R0600 Dyspnea, unspecified: Secondary | ICD-10-CM | POA: Diagnosis not present

## 2020-06-22 DIAGNOSIS — J449 Chronic obstructive pulmonary disease, unspecified: Secondary | ICD-10-CM | POA: Diagnosis not present

## 2020-06-26 ENCOUNTER — Ambulatory Visit: Payer: Medicare Other | Admitting: Dermatology

## 2020-06-26 ENCOUNTER — Other Ambulatory Visit: Payer: Self-pay

## 2020-06-26 DIAGNOSIS — L853 Xerosis cutis: Secondary | ICD-10-CM | POA: Diagnosis not present

## 2020-06-26 DIAGNOSIS — L821 Other seborrheic keratosis: Secondary | ICD-10-CM | POA: Diagnosis not present

## 2020-06-26 DIAGNOSIS — D692 Other nonthrombocytopenic purpura: Secondary | ICD-10-CM

## 2020-06-26 DIAGNOSIS — L509 Urticaria, unspecified: Secondary | ICD-10-CM | POA: Diagnosis not present

## 2020-06-26 NOTE — Progress Notes (Signed)
   Follow-Up Visit   Subjective  Kristen Bailey is a 86 y.o. female who presents for the following: Urticaria (3 weeks f/u Urticaria trunk, exts pt taking Allegra and Singulair with a fair response with decreased itch and rash ).  Pt report she recently noticed bed bugs in her home and she feels the bugs were definitively identified as bed bugs.  Her residence is being treated for bed bugs in the next few days.   The following portions of the chart were reviewed this encounter and updated as appropriate:  Tobacco  Allergies  Meds  Problems  Med Hx  Surg Hx  Fam Hx     Review of Systems:  No other skin or systemic complaints except as noted in HPI or Assessment and Plan.  Objective  Well appearing patient in no apparent distress; mood and affect are within normal limits.  A focused examination was performed including face, exts. Relevant physical exam findings are noted in the Assessment and Plan.  Objective  Left Upper Arm - Anterior, trunk, exts: Extremely dry skin   Objective  Left Upper Arm - Anterior: Mainly clear of hives    Assessment & Plan  Xerosis cutis (2) Left Upper Arm - Anterior; trunk, exts  Urticaria with Pruritus - appears resolved today. May be related to Bed Bug discovery at her residence - being definitively treated in next few days. Left Upper Arm - Anterior  Discussed with pt bed bugs could cause urticaria   Cont Allegra 180 mg daily  Cont Singulair 10 mg daily  Cont current medications for Urticaria until after she have a exterminator come out to her home in 3 days.  If Urticaria stays clear after Bed Bug treatment, she may D/C meds and see if she has recurrent hives.  Other Related Medications montelukast (SINGULAIR) 10 MG tablet   Seborrheic Keratoses - Stuck-on, waxy, tan-brown papules and plaques  - Discussed benign etiology and prognosis. - Observe - Call for any changes  Purpura - Violaceous macules and patches - Benign -  Related to age, sun damage and/or use of blood thinners - Observe - Can use OTC arnica containing moisturizer such as Dermend Bruise Formula if desired - Call for worsening or other concerns  Return in about 1 year (around 06/26/2021).   IMarye Round, CMA, am acting as scribe for Sarina Ser, MD .  Documentation: I have reviewed the above documentation for accuracy and completeness, and I agree with the above.  Sarina Ser, MD

## 2020-06-27 ENCOUNTER — Encounter: Payer: Self-pay | Admitting: Dermatology

## 2020-06-29 ENCOUNTER — Ambulatory Visit: Payer: Medicare Other | Admitting: Dermatology

## 2020-07-31 ENCOUNTER — Other Ambulatory Visit: Payer: Self-pay | Admitting: Dermatology

## 2020-07-31 DIAGNOSIS — L509 Urticaria, unspecified: Secondary | ICD-10-CM

## 2020-08-24 ENCOUNTER — Ambulatory Visit
Admission: RE | Admit: 2020-08-24 | Discharge: 2020-08-24 | Disposition: A | Discharge status: Home / Self Care | Payer: Medicare Other | Source: Ambulatory Visit | Attending: Internal Medicine | Admitting: Internal Medicine

## 2020-08-24 DIAGNOSIS — E2839 Other primary ovarian failure: Secondary | ICD-10-CM | POA: Insufficient documentation

## 2020-08-24 DIAGNOSIS — Z1231 Encounter for screening mammogram for malignant neoplasm of breast: Secondary | ICD-10-CM | POA: Insufficient documentation

## 2020-08-24 DIAGNOSIS — Z780 Asymptomatic menopausal state: Secondary | ICD-10-CM | POA: Diagnosis not present

## 2020-08-25 ENCOUNTER — Other Ambulatory Visit: Payer: Self-pay | Admitting: Internal Medicine

## 2020-08-25 DIAGNOSIS — R921 Mammographic calcification found on diagnostic imaging of breast: Secondary | ICD-10-CM

## 2020-08-25 DIAGNOSIS — R928 Other abnormal and inconclusive findings on diagnostic imaging of breast: Secondary | ICD-10-CM

## 2020-09-07 ENCOUNTER — Ambulatory Visit: Payer: Medicare Other

## 2020-09-13 ENCOUNTER — Telehealth (INDEPENDENT_AMBULATORY_CARE_PROVIDER_SITE_OTHER): Payer: Medicare Other | Admitting: Internal Medicine

## 2020-09-13 ENCOUNTER — Other Ambulatory Visit: Payer: Self-pay

## 2020-09-13 ENCOUNTER — Encounter: Payer: Self-pay | Admitting: Internal Medicine

## 2020-09-13 VITALS — Ht 63.5 in | Wt 142.0 lb

## 2020-09-13 DIAGNOSIS — R110 Nausea: Secondary | ICD-10-CM | POA: Diagnosis not present

## 2020-09-13 DIAGNOSIS — K580 Irritable bowel syndrome with diarrhea: Secondary | ICD-10-CM

## 2020-09-13 DIAGNOSIS — M79605 Pain in left leg: Secondary | ICD-10-CM

## 2020-09-13 DIAGNOSIS — Z1329 Encounter for screening for other suspected endocrine disorder: Secondary | ICD-10-CM

## 2020-09-13 DIAGNOSIS — E785 Hyperlipidemia, unspecified: Secondary | ICD-10-CM | POA: Diagnosis not present

## 2020-09-13 DIAGNOSIS — Z1389 Encounter for screening for other disorder: Secondary | ICD-10-CM

## 2020-09-13 DIAGNOSIS — R197 Diarrhea, unspecified: Secondary | ICD-10-CM | POA: Diagnosis not present

## 2020-09-13 DIAGNOSIS — M79604 Pain in right leg: Secondary | ICD-10-CM

## 2020-09-13 DIAGNOSIS — M810 Age-related osteoporosis without current pathological fracture: Secondary | ICD-10-CM | POA: Diagnosis not present

## 2020-09-13 DIAGNOSIS — Z599 Problem related to housing and economic circumstances, unspecified: Secondary | ICD-10-CM

## 2020-09-13 MED ORDER — CHOLESTYRAMINE 4 G PO PACK: 4.0000 g | PACK | Freq: Every day | ORAL | 5 refills | Status: DC

## 2020-09-13 MED ORDER — ONDANSETRON HCL 4 MG PO TABS: 4.0000 mg | ORAL_TABLET | Freq: Three times a day (TID) | ORAL | 1 refills | Status: DC | PRN

## 2020-09-13 NOTE — Patient Instructions (Addendum)
Denosumab injection/Prolia every 6 months   What is this medicine? DENOSUMAB (den oh sue mab) slows bone breakdown. Prolia is used to treat osteoporosis in women after menopause and in men, and in people who are taking corticosteroids for 6 months or more. Delton See is used to treat a high calcium level due to cancer and to prevent bone fractures and other bone problems caused by multiple myeloma or cancer bone metastases. Delton See is also used to treat giant cell tumor of the bone. This medicine may be used for other purposes; ask your health care provider or pharmacist if you have questions. COMMON BRAND NAME(S): Prolia, XGEVA What should I tell my health care provider before I take this medicine? They need to know if you have any of these conditions:  dental disease  having surgery or tooth extraction  infection  kidney disease  low levels of calcium or Vitamin D in the blood  malnutrition  on hemodialysis  skin conditions or sensitivity  thyroid or parathyroid disease  an unusual reaction to denosumab, other medicines, foods, dyes, or preservatives  pregnant or trying to get pregnant  breast-feeding How should I use this medicine? This medicine is for injection under the skin. It is given by a health care professional in a hospital or clinic setting. A special MedGuide will be given to you before each treatment. Be sure to read this information carefully each time. For Prolia, talk to your pediatrician regarding the use of this medicine in children. Special care may be needed. For Delton See, talk to your pediatrician regarding the use of this medicine in children. While this drug may be prescribed for children as young as 13 years for selected conditions, precautions do apply. Overdosage: If you think you have taken too much of this medicine contact a poison control center or emergency room at once. NOTE: This medicine is only for you. Do not share this medicine with others. What if I  miss a dose? It is important not to miss your dose. Call your doctor or health care professional if you are unable to keep an appointment. What may interact with this medicine? Do not take this medicine with any of the following medications:  other medicines containing denosumab This medicine may also interact with the following medications:  medicines that lower your chance of fighting infection  steroid medicines like prednisone or cortisone This list may not describe all possible interactions. Give your health care provider a list of all the medicines, herbs, non-prescription drugs, or dietary supplements you use. Also tell them if you smoke, drink alcohol, or use illegal drugs. Some items may interact with your medicine. What should I watch for while using this medicine? Visit your doctor or health care professional for regular checks on your progress. Your doctor or health care professional may order blood tests and other tests to see how you are doing. Call your doctor or health care professional for advice if you get a fever, chills or sore throat, or other symptoms of a cold or flu. Do not treat yourself. This drug may decrease your body's ability to fight infection. Try to avoid being around people who are sick. You should make sure you get enough calcium and vitamin D while you are taking this medicine, unless your doctor tells you not to. Discuss the foods you eat and the vitamins you take with your health care professional. See your dentist regularly. Brush and floss your teeth as directed. Before you have any dental work done,  tell your dentist you are receiving this medicine. Do not become pregnant while taking this medicine or for 5 months after stopping it. Talk with your doctor or health care professional about your birth control options while taking this medicine. Women should inform their doctor if they wish to become pregnant or think they might be pregnant. There is a potential  for serious side effects to an unborn child. Talk to your health care professional or pharmacist for more information. What side effects may I notice from receiving this medicine? Side effects that you should report to your doctor or health care professional as soon as possible:  allergic reactions like skin rash, itching or hives, swelling of the face, lips, or tongue  bone pain  breathing problems  dizziness  jaw pain, especially after dental work  redness, blistering, peeling of the skin  signs and symptoms of infection like fever or chills; cough; sore throat; pain or trouble passing urine  signs of low calcium like fast heartbeat, muscle cramps or muscle pain; pain, tingling, numbness in the hands or feet; seizures  unusual bleeding or bruising  unusually weak or tired Side effects that usually do not require medical attention (report to your doctor or health care professional if they continue or are bothersome):  constipation  diarrhea  headache  joint pain  loss of appetite  muscle pain  runny nose  tiredness  upset stomach This list may not describe all possible side effects. Call your doctor for medical advice about side effects. You may report side effects to FDA at 1-800-FDA-1088. Where should I keep my medicine? This medicine is only given in a clinic, doctor's office, or other health care setting and will not be stored at home. NOTE: This sheet is a summary. It may not cover all possible information. If you have questions about this medicine, talk to your doctor, pharmacist, or health care provider.  2020 Elsevier/Gold Standard (2018-04-10 16:10:44)  Osteoporosis  Osteoporosis is thinning and loss of density in your bones. Osteoporosis makes bones more brittle and fragile and more likely to break (fracture). Over time, osteoporosis can cause your bones to become so weak that they fracture after a minor fall. Bones in the hip, wrist, and spine are most  likely to fracture due to osteoporosis. What are the causes? The exact cause of this condition is not known. What increases the risk? You may be at greater risk for osteoporosis if you:  Have a family history of the condition.  Have poor nutrition.  Use steroid medicines, such as prednisone.  Are female.  Are age 77 or older.  Smoke or have a history of smoking.  Are not physically active (are sedentary).  Are white (Caucasian) or of Asian descent.  Have a small body frame.  Take certain medicines, such as antiseizure medicines. What are the signs or symptoms? A fracture might be the first sign of osteoporosis, especially if the fracture results from a fall or injury that usually would not cause a bone to break. Other signs and symptoms include:  Pain in the neck or low back.  Stooped posture.  Loss of height. How is this diagnosed? This condition may be diagnosed based on:  Your medical history.  A physical exam.  A bone mineral density test, also called a DXA or DEXA test (dual-energy X-ray absorptiometry test). This test uses X-rays to measure the amount of minerals in your bones. How is this treated? The goal of treatment is to strengthen your  bones and lower your risk for a fracture. Treatment may involve:  Making lifestyle changes, such as: ? Including foods with more calcium and vitamin D in your diet. ? Doing weight-bearing and muscle-strengthening exercises. ? Stopping tobacco use. ? Limiting alcohol intake.  Taking medicine to slow the process of bone loss or to increase bone density.  Taking daily supplements of calcium and vitamin D.  Taking hormone replacement medicines, such as estrogen for women and testosterone for men.  Monitoring your levels of calcium and vitamin D. Follow these instructions at home:  Activity  Exercise as told by your health care provider. Ask your health care provider what exercises and activities are safe for you. You  should do: ? Exercises that make you work against gravity (weight-bearing exercises), such as tai chi, yoga, or walking. ? Exercises to strengthen muscles, such as lifting weights. Lifestyle  Limit alcohol intake to no more than 1 drink a day for nonpregnant women and 2 drinks a day for men. One drink equals 12 oz of beer, 5 oz of wine, or 1 oz of hard liquor.  Do not use any products that contain nicotine or tobacco, such as cigarettes and e-cigarettes. If you need help quitting, ask your health care provider. Preventing falls  Use devices to help you move around (mobility aids) as needed, such as canes, walkers, scooters, or crutches.  Keep rooms well-lit and clutter-free.  Remove tripping hazards from walkways, including cords and throw rugs.  Install grab bars in bathrooms and safety rails on stairs.  Use rubber mats in the bathroom and other areas that are often wet or slippery.  Wear closed-toe shoes that fit well and support your feet. Wear shoes that have rubber soles or low heels.  Review your medicines with your health care provider. Some medicines can cause dizziness or changes in blood pressure, which can increase your risk of falling. General instructions  Include calcium and vitamin D in your diet. Calcium is important for bone health, and vitamin D helps your body to absorb calcium. Good sources of calcium and vitamin D include: ? Certain fatty fish, such as salmon and tuna. ? Products that have calcium and vitamin D added to them (fortified products), such as fortified cereals. ? Egg yolks. ? Cheese. ? Liver.  Take over-the-counter and prescription medicines only as told by your health care provider.  Keep all follow-up visits as told by your health care provider. This is important. Contact a health care provider if:  You have never been screened for osteoporosis and you are: ? A woman who is age 64 or older. ? A man who is age 11 or older. Get help right away  if:  You fall or injure yourself. Summary  Osteoporosis is thinning and loss of density in your bones. This makes bones more brittle and fragile and more likely to break (fracture),even with minor falls.  The goal of treatment is to strengthen your bones and reduce your risk for a fracture.  Include calcium and vitamin D in your diet. Calcium is important for bone health, and vitamin D helps your body to absorb calcium.  Talk with your health care provider about screening for osteoporosis if you are a woman who is age 24 or older, or a man who is age 21 or older. This information is not intended to replace advice given to you by your health care provider. Make sure you discuss any questions you have with your health care provider. Document Revised:  11/14/2017 Document Reviewed: 09/26/2017 Elsevier Patient Education  [place].  Diet for Irritable Bowel Syndrome When you have irritable bowel syndrome (IBS), it is very important to eat the foods and follow the eating habits that are best for your condition. IBS may cause various symptoms such as pain in the abdomen, constipation, or diarrhea. Choosing the right foods can help to ease the discomfort from these symptoms. Work with your health care provider and diet and nutrition specialist (dietitian) to find the eating plan that will help to control your symptoms. What are tips for following this plan?      Keep a food diary. This will help you identify foods that cause symptoms. Write down: ? What you eat and when you eat it. ? What symptoms you have. ? When symptoms occur in relation to your meals, such as "pain in abdomen 2 hours after dinner."  Eat your meals slowly and in a relaxed setting.  Aim to eat 5-6 small meals per day. Do not skip meals.  Drink enough fluid to keep your urine pale yellow.  Ask your health care provider if you should take an over-the-counter probiotic to help restore healthy bacteria in your gut  (digestive tract). ? Probiotics are foods that contain good bacteria and yeasts.  Your dietitian may have specific dietary recommendations for you based on your symptoms. He or she may recommend that you: ? Avoid foods that cause symptoms. Talk with your dietitian about other ways to get the same nutrients that are in those problem foods. ? Avoid foods with gluten. Gluten is a protein that is found in rye, wheat, and barley. ? Eat more foods that contain soluble fiber. Examples of foods with high soluble fiber include oats, seeds, and certain fruits and vegetables. Take a fiber supplement if directed by your dietitian. ? Reduce or avoid certain foods called FODMAPs. These are foods that contain carbohydrates that are hard to digest. Ask your doctor which foods contain these carbohydrates. What foods are not recommended? The following are some foods and drinks that may make your symptoms worse:  Fatty foods, such as french fries.  Foods that contain gluten, such as pasta and cereal.  Dairy products, such as milk, cheese, and ice cream.  Chocolate.  Alcohol.  Products with caffeine, such as coffee.  Carbonated drinks, such as soda.  Foods that are high in FODMAPs. These include certain fruits and vegetables.  Products with sweeteners such as honey, high fructose corn syrup, sorbitol, and mannitol. The items listed above may not be a complete list of foods and beverages you should avoid. Contact a dietitian for more information. What foods are good sources of fiber? Your health care provider or dietitian may recommend that you eat more foods that contain fiber. Fiber can help to reduce constipation and other IBS symptoms. Add foods with fiber to your diet a little at a time so your body can get used to them. Too much fiber at one time might cause gas and swelling of your abdomen. The following are some foods that are good sources of fiber:  Berries, such as raspberries, strawberries, and  blueberries.  Tomatoes.  Carrots.  Brown rice.  Oats.  Seeds, such as chia and pumpkin seeds. The items listed above may not be a complete list of recommended sources of fiber. Contact your dietitian for more options. Where to find more information  International Foundation for Functional Gastrointestinal Disorders: www.iffgd.[employer] of Diabetes and Digestive and Kidney  Diseases: [employer] Summary  When you have irritable bowel syndrome (IBS), it is very important to eat the foods and follow the eating habits that are best for your condition.  IBS may cause various symptoms such as pain in the abdomen, constipation, or diarrhea.  Choosing the right foods can help to ease the discomfort that comes from symptoms.  Keep a food diary. This will help you identify foods that cause symptoms.  Your health care provider or diet and nutrition specialist (dietitian) may recommend that you eat more foods that contain fiber. This information is not intended to replace advice given to you by your health care provider. Make sure you discuss any questions you have with your health care provider. Document Revised: 03/24/2019 Document Reviewed: 08/05/2017 Elsevier Patient Education  [place]  FODMAPs (fermentable oligosaccharides, disaccharides, monosaccharides, and polyols) are sugars that are hard for some people to digest. A low-FODMAP eating plan may help some people who have bowel (intestinal) diseases to manage their symptoms. This meal plan can be complicated to follow. Work with a diet and nutrition specialist (dietitian) to make a low-FODMAP eating plan that is right for you. A dietitian can make sure that you get enough nutrition from this diet. What are tips for following this plan? Reading food labels  Check labels for hidden FODMAPs such as: ? High-fructose syrup. ? Honey. ? Agave. ? Natural fruit flavors. ? Onion or  garlic powder.  Choose low-FODMAP foods that contain 3-4 grams of fiber per serving.  Check food labels for serving sizes. Eat only one serving at a time to make sure FODMAP levels stay low. Meal planning  Follow a low-FODMAP eating plan for up to 6 weeks, or as told by your health care provider or dietitian.  To follow the eating plan: 1. Eliminate high-FODMAP foods from your diet completely. 2. Gradually reintroduce high-FODMAP foods into your diet one at a time. Most people should wait a few days after introducing one high-FODMAP food before they introduce the next high-FODMAP food. Your dietitian can recommend how quickly you may reintroduce foods. 3. Keep a daily record of what you eat and drink, and make note of any symptoms that you have after eating. 4. Review your daily record with a dietitian regularly. Your dietitian can help you identify which foods you can eat and which foods you should avoid. General tips  Drink enough fluid each day to keep your urine pale yellow.  Avoid processed foods. These often have added sugar and may be high in FODMAPs.  Avoid most dairy products, whole grains, and sweeteners.  Work with a dietitian to make sure you get enough fiber in your diet. Recommended foods Grains  Gluten-free grains, such as rice, oats, buckwheat, quinoa, corn, polenta, and millet. Gluten-free pasta, bread, or cereal. Rice noodles. Corn tortillas. Vegetables  Eggplant, zucchini, cucumber, peppers, green beans, Brussels sprouts, bean sprouts, lettuce, arugula, kale, Swiss chard, spinach, collard greens, bok choy, summer squash, potato, and tomato. Limited amounts of corn, carrot, and sweet potato. Green parts of scallions. Fruits  Bananas, oranges, lemons, limes, blueberries, raspberries, strawberries, grapes, cantaloupe, honeydew melon, kiwi, papaya, passion fruit, and pineapple. Limited amounts of dried cranberries, banana chips, and shredded  coconut. Dairy  Lactose-free milk, yogurt, and kefir. Lactose-free cottage cheese and ice cream. Non-dairy milks, such as almond, coconut, hemp, and rice milk. Yogurts made of non-dairy milks. Limited amounts of goat cheese, brie, mozzarella, parmesan, swiss, and other hard cheeses. Meats  and other protein foods  Unseasoned beef, pork, poultry, or fish. Eggs. Berniece Salines. Tofu (firm) and tempeh. Limited amounts of nuts and seeds, such as almonds, walnuts, [place] nuts, pecans, peanuts, pumpkin seeds, chia seeds, and sunflower seeds. Fats and oils  Butter-free spreads. Vegetable oils, such as olive, canola, and sunflower oil. Seasoning and other foods  Artificial sweeteners with names that do not end in "ol" such as aspartame, saccharine, and stevia. Maple syrup, white table sugar, raw sugar, brown sugar, and molasses. Fresh basil, coriander, parsley, rosemary, and thyme. Beverages  Water and mineral water. Sugar-sweetened soft drinks. Small amounts of orange juice or cranberry juice. Black and green tea. Most dry wines. Coffee. This may not be a complete list of low-FODMAP foods. Talk with your dietitian for more information. Foods to avoid Grains  Wheat, including kamut, durum, and semolina. Barley and bulgur. Couscous. Wheat-based cereals. Wheat noodles, bread, crackers, and pastries. Vegetables  Chicory root, artichoke, asparagus, cabbage, snow peas, sugar snap peas, mushrooms, and cauliflower. Onions, garlic, leeks, and the white part of scallions. Fruits  Fresh, dried, and juiced forms of apple, pear, watermelon, peach, plum, cherries, apricots, blackberries, boysenberries, figs, nectarines, and mango. Avocado. Dairy  Milk, yogurt, ice cream, and soft cheese. Cream and sour cream. Milk-based sauces. Custard. Meats and other protein foods  Fried or fatty meat. Sausage. Cashews and pistachios. Soybeans, baked beans, black beans, chickpeas, kidney beans, fava beans, navy beans, lentils,  and split peas. Seasoning and other foods  Any sugar-free gum or candy. Foods that contain artificial sweeteners such as sorbitol, mannitol, isomalt, or xylitol. Foods that contain honey, high-fructose corn syrup, or agave. Bouillon, vegetable stock, beef stock, and chicken stock. Garlic and onion powder. Condiments made with onion, such as hummus, chutney, pickles, relish, salad dressing, and salsa. Tomato paste. Beverages  Chicory-based drinks. Coffee substitutes. Chamomile tea. Fennel tea. Sweet or fortified wines such as port or sherry. Diet soft drinks made with isomalt, mannitol, maltitol, sorbitol, or xylitol. Apple, pear, and mango juice. Juices with high-fructose corn syrup. This may not be a complete list of high-FODMAP foods. Talk with your dietitian to discuss what dietary choices are best for you.  Summary  A low-FODMAP eating plan is a short-term diet that eliminates FODMAPs from your diet to help ease symptoms of certain bowel diseases.  The eating plan usually lasts up to 6 weeks. After that, high-FODMAP foods are restarted gradually, one at a time, so you can find out which may be causing symptoms.  A low-FODMAP eating plan can be complicated. It is best to work with a dietitian who has experience with this type of plan. This information is not intended to replace advice given to you by your health care provider. Make sure you discuss any questions you have with your health care provider. Document Revised: 11/14/2017 Document Reviewed: 07/29/2017 Elsevier Patient Education  [place].

## 2020-09-13 NOTE — Progress Notes (Signed)
Telephone Note  I connected with Kristen Bailey  on 09/13/20 at  4:20 PM EDT by telephone and verified that I am speaking with the correct person using two identifiers.  Location patient: home Location provider:work or home office Persons participating in the virtual visit: patient, provider  I discussed the limitations of evaluation and management by telemedicine and the availability of in person appointments. The patient expressed understanding and agreed to proceed.   HPI: 1. DEXA 9/9 +osteoporosis/penia  agreeable to prolia will check cost with insurance  2. Diarrhea s/p GB removal resumed wants refill of questran and having ibs sx's and nausea tried immodium otc and wants refill zofran  Est Gi Dr. Jonathon Bellows  3. Leg pain 2017 ABI Dr. Lucky Cowboy normal if continues disc consider Xray low back vs f/u vascular to further work up leg pain worse at night.  4.HLD unable to tolerate statin disc zetia consider in future   Past Medical History:  Diagnosis Date  . Anxiety   . Chicken pox   . COPD (chronic obstructive pulmonary disease) ([place])   . Essential tremor   . Hypertension   . Hypomagnesemia   . 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 ABDOMINAL HYSTERECTOMY    . APPENDECTOMY    . CHOLECYSTECTOMY    . OVARIAN CYST REMOVAL    . SMALL INTESTINE SURGERY       Current Outpatient Medications:  .  acetaminophen (TYLENOL) 500 MG tablet, Take 500 mg by mouth every 6 (six) hours as needed., Disp: , Rfl:  .  albuterol (VENTOLIN HFA) 108 (90 Base) MCG/ACT inhaler, Inhale 1-2 puffs into the lungs every 6 (six) hours as needed for wheezing or shortness of breath., Disp: , Rfl:  .  amLODipine (NORVASC) 5 MG tablet, Take 1 tablet (5 mg total) by mouth daily., Disp: 90 tablet, Rfl: 3 .  budesonide-formoterol (SYMBICORT) 160-4.5 MCG/ACT inhaler, Inhale 2 puffs into the  lungs 2 (two) times daily., Disp: , Rfl:  .  calcium carbonate (OSCAL) 1500 (600 Ca) MG TABS tablet, Take 1,500 mg by mouth 2 (two) times daily with a meal. , Disp: , Rfl:  .  Cholecalciferol (VITAMIN D-1000 MAX ST) 1000 units tablet, Take 1,000 Units by mouth daily. , Disp: , Rfl:  .  fluticasone (FLONASE) 50 MCG/ACT nasal spray, Place into the nose., Disp: , Rfl:  .  hydrocortisone 2.5 % ointment, Apply topically 2 (two) times daily. As needed to skin, Disp: 60 g, Rfl: 2 .  hydrOXYzine (ATARAX/VISTARIL) 25 MG tablet, Take 1 tablet (25 mg total) by mouth 2 (two) times daily as needed. Try at night first. Please deliver. Stop allegra, Disp: 60 tablet, Rfl: 2 .  lisinopril-hydrochlorothiazide (ZESTORETIC) 10-12.5 MG tablet, Take 1 tablet by mouth daily. , Disp: , Rfl:  .  LORazepam (ATIVAN) 1 MG tablet, Take 1 mg by mouth 2 (two) times daily. , Disp: , Rfl:  .  Magnesium 400 MG TABS, Take by mouth daily., Disp: , Rfl:  .  montelukast (SINGULAIR) 10 MG tablet, TAKE 1 TABLET BY MOUTH AT BEDTIME, Disp: 30 tablet, Rfl: 1 .  potassium chloride SA (K-DUR) 20 MEQ tablet, Take 1 tablet (20 mEq total) by mouth daily., Disp: 90 tablet, Rfl: 3 .  tiotropium (SPIRIVA) 18 MCG inhalation capsule, Place 18 mcg into inhaler and inhale daily., Disp: , Rfl:  .  cholestyramine (QUESTRAN) 4 g packet, Take 1 packet (4 g total) by mouth daily before breakfast for 1 dose. As needed, Disp: 60 each, Rfl: 5 .  diclofenac sodium (VOLTAREN) 1 % GEL, Apply 2-4 g topically 4 (four) times daily. Prn 2 g upper body and 4 g lower body (Patient not taking: Reported on 09/13/2020), Disp: 100 g, Rfl: 11 .  levalbuterol (XOPENEX) 0.63 MG/3ML nebulizer solution, , Disp: , Rfl:  .  ondansetron (ZOFRAN) 4 MG tablet, Take 1 tablet (4 mg total) by mouth every 8 (eight) hours as needed for nausea or vomiting., Disp: 90 tablet, Rfl: 1  EXAM: Vitals with BMI 09/13/2020 04/11/2020 03/22/2020  Height 5' 3.5" 5' 3.5" 5' 3.5"  Weight 142 lbs 142 lbs  144 lbs 6 oz  BMI 24.76 53.61 44.31  Systolic - - 540  Diastolic - - 70  Pulse - - 86   VITALS per patient if applicable:  GENERAL: alert, oriented, appears well and in no acute distress  PSYCH/NEURO: pleasant and cooperative, no obvious depression or anxiety, speech and thought processing grossly intact  ASSESSMENT AND PLAN:  Discussed the following assessment and plan:  Osteoporosis, unspecified osteoporosis type, unspecified pathological fracture presence Try to get prolia approved   Hyperlipidemia, unspecified hyperlipidemia type Consider zetia in future  Nausea - Plan: ondansetron (ZOFRAN) 4 MG tablet Diarrhea, unspecified type - Plan: cholestyramine (QUESTRAN) 4 g packet Irritable bowel syndrome with diarrhea Prn immodium   Pain in both lower extremities Consider Xray low back and or f/u vascular    HM Flu 10/06/19 with pulm clinic Dr. Raul Del Check prior vaccines alliance (per pt had 10/04/18 pna shot and flu) moderna 2/2   Tdap 02/09/16  Out of age window pap, colonoscopy mammo [place] 08/24/20 abnormal sch 09/2020   9/9 DEXA +osteoporosis try to get prolia approved   Former smoker   GI Dr. Tiffany Kocher  Lung Dr. Rosita Kea Dr. Michaelene Song AE  Fasting labs spring 09/2020   -we discussed possible serious and likely etiologies, options for evaluation and workup, limitations of telemedicine visit vs in person visit, treatment, treatment risks and precautions.     I discussed the assessment and treatment plan with the patient. The patient was provided an opportunity to ask questions and all were answered. The patient agreed with the plan and demonstrated an understanding of the instructions.    Time spent 20 min Delorise Jackson, MD

## 2020-09-15 ENCOUNTER — Telehealth: Payer: Self-pay | Admitting: *Deleted

## 2020-09-15 DIAGNOSIS — J449 Chronic obstructive pulmonary disease, unspecified: Secondary | ICD-10-CM

## 2020-09-15 DIAGNOSIS — M810 Age-related osteoporosis without current pathological fracture: Secondary | ICD-10-CM

## 2020-09-15 NOTE — Telephone Encounter (Signed)
Approval for Prolia in Process. [id number].

## 2020-09-15 NOTE — Telephone Encounter (Signed)
-----   Message from Delorise Jackson, MD sent at 09/13/2020  4:47 PM EDT ----- Can we get prolia approved

## 2020-09-19 DIAGNOSIS — J449 Chronic obstructive pulmonary disease, unspecified: Secondary | ICD-10-CM | POA: Diagnosis not present

## 2020-09-19 DIAGNOSIS — Z9981 Dependence on supplemental oxygen: Secondary | ICD-10-CM | POA: Diagnosis not present

## 2020-09-19 DIAGNOSIS — R0600 Dyspnea, unspecified: Secondary | ICD-10-CM | POA: Diagnosis not present

## 2020-09-20 NOTE — Telephone Encounter (Signed)
Ok to schedule.

## 2020-09-20 NOTE — Telephone Encounter (Signed)
Prolia approved Ok to schedule?

## 2020-09-21 ENCOUNTER — Other Ambulatory Visit: Payer: Self-pay | Admitting: Internal Medicine

## 2020-09-21 ENCOUNTER — Ambulatory Visit
Admission: RE | Admit: 2020-09-21 | Discharge: 2020-09-21 | Disposition: A | Discharge status: Home / Self Care | Payer: Medicare Other | Source: Ambulatory Visit | Attending: Internal Medicine | Admitting: Internal Medicine

## 2020-09-21 DIAGNOSIS — R921 Mammographic calcification found on diagnostic imaging of breast: Secondary | ICD-10-CM | POA: Insufficient documentation

## 2020-09-21 DIAGNOSIS — R928 Other abnormal and inconclusive findings on diagnostic imaging of breast: Secondary | ICD-10-CM | POA: Insufficient documentation

## 2020-09-22 ENCOUNTER — Telehealth: Payer: Self-pay

## 2020-09-22 NOTE — Telephone Encounter (Signed)
Called patient concerning Prolia she is having a hard time paying for her COPD medications Spiriva and Symbicort and Prolia cost will be 195 out of pocket. Discussed with Catie and placed a CCM referral for patient for medication assistance. Nothing PCP needs to do just FYI.  Patient will discuss Prolia with children and call me back.

## 2020-09-22 NOTE — Chronic Care Management (AMB) (Signed)
  Chronic Care Management   Note  09/22/2020 Name: Kristen Bailey MRN: [id number] DOB: 04-21-1933  Kristen Bailey is a 86 y.o. year old female who is a primary care patient of McLean-Scocuzza, Nino Glow, MD. I reached out to [place] by phone today in response to a referral sent by Kristen Bailey's PCP, Orland Mustard, MD     Kristen Bailey was given information about Chronic Care Management services today including:  1. CCM service includes personalized support from designated clinical staff supervised by her physician, including individualized plan of care and coordination with other care providers 2. 24/7 contact phone numbers for assistance for urgent and routine care needs. 3. Service will only be billed when office clinical staff spend 20 minutes or more in a month to coordinate care. 4. Only one practitioner may furnish and bill the service in a calendar month. 5. The patient may stop CCM services at any time (effective at the end of the month) by phone call to the office staff. 6. The patient will be responsible for cost sharing (co-pay) of up to 20% of the service fee (after annual deductible is met).  Patient agreed to services and verbal consent obtained.   Follow up plan: Telephone appointment with care management team member scheduled for: Pharm D 09/29/2020  Noreene Larsson, [address], [place], [place] [address] Direct Dial: [phone] [email or web address]: [email or web address]

## 2020-09-23 DIAGNOSIS — J449 Chronic obstructive pulmonary disease, unspecified: Secondary | ICD-10-CM | POA: Diagnosis not present

## 2020-09-24 NOTE — Telephone Encounter (Signed)
Referral placed. Thank you

## 2020-09-24 NOTE — Addendum Note (Signed)
Addended by: Orland Mustard on: 09/24/2020 06:58 PM   Modules accepted: Orders

## 2020-09-26 ENCOUNTER — Other Ambulatory Visit: Payer: Medicare Other

## 2020-09-26 ENCOUNTER — Ambulatory Visit: Payer: Medicare Other | Admitting: Internal Medicine

## 2020-09-28 ENCOUNTER — Ambulatory Visit: Payer: Medicare Other | Attending: Internal Medicine

## 2020-09-28 ENCOUNTER — Other Ambulatory Visit: Payer: Self-pay

## 2020-09-28 ENCOUNTER — Other Ambulatory Visit (INDEPENDENT_AMBULATORY_CARE_PROVIDER_SITE_OTHER): Payer: Medicare Other

## 2020-09-28 DIAGNOSIS — Z23 Encounter for immunization: Secondary | ICD-10-CM

## 2020-09-28 DIAGNOSIS — Z1329 Encounter for screening for other suspected endocrine disorder: Secondary | ICD-10-CM | POA: Diagnosis not present

## 2020-09-28 DIAGNOSIS — Z1389 Encounter for screening for other disorder: Secondary | ICD-10-CM

## 2020-09-28 DIAGNOSIS — E785 Hyperlipidemia, unspecified: Secondary | ICD-10-CM | POA: Diagnosis not present

## 2020-09-28 DIAGNOSIS — R197 Diarrhea, unspecified: Secondary | ICD-10-CM

## 2020-09-28 LAB — CBC WITH DIFFERENTIAL/PLATELET
Basophils Absolute: 0 10*3/uL (ref 0.0–0.1)
Basophils Relative: 0.9 % (ref 0.0–3.0)
Eosinophils Absolute: 0.1 10*3/uL (ref 0.0–0.7)
Eosinophils Relative: 1.2 % (ref 0.0–5.0)
HCT: 39.8 % (ref 36.0–46.0)
Hemoglobin: 13.2 g/dL (ref 12.0–15.0)
Lymphocytes Relative: 34.3 % (ref 12.0–46.0)
Lymphs Abs: 1.9 10*3/uL (ref 0.7–4.0)
MCHC: 33.3 g/dL (ref 30.0–36.0)
MCV: 91.4 fl (ref 78.0–100.0)
Monocytes Absolute: 0.4 10*3/uL (ref 0.1–1.0)
Monocytes Relative: 7.4 % (ref 3.0–12.0)
Neutro Abs: 3.1 10*3/uL (ref 1.4–7.7)
Neutrophils Relative %: 56.2 % (ref 43.0–77.0)
Platelets: 269 10*3/uL (ref 150.0–400.0)
RBC: 4.35 Mil/uL (ref 3.87–5.11)
RDW: 13.4 % (ref 11.5–15.5)
WBC: 5.6 10*3/uL (ref 4.0–10.5)

## 2020-09-28 LAB — LIPID PANEL
Cholesterol: 245 mg/dL — ABNORMAL HIGH (ref 0–200)
HDL: 103.2 mg/dL (ref >39.00)
LDL Cholesterol: 119 mg/dL — ABNORMAL HIGH (ref 0–99)
NonHDL: 141.32
Total CHOL/HDL Ratio: 2
Triglycerides: 114 mg/dL (ref 0.0–149.0)
VLDL: 22.8 mg/dL (ref 0.0–40.0)

## 2020-09-28 LAB — COMPREHENSIVE METABOLIC PANEL
ALT: 8 U/L (ref 0–35)
AST: 15 U/L (ref 0–37)
Albumin: 4.2 g/dL (ref 3.5–5.2)
Alkaline Phosphatase: 53 U/L (ref 39–117)
BUN: 25 mg/dL — ABNORMAL HIGH (ref 6–23)
CO2: 32 mEq/L (ref 19–32)
Calcium: 9.3 mg/dL (ref 8.4–10.5)
Chloride: 99 mEq/L (ref 96–112)
Creatinine, Ser: 0.91 mg/dL (ref 0.40–1.20)
GFR: 57.59 mL/min — ABNORMAL LOW (ref >60.00)
Glucose, Bld: 111 mg/dL — ABNORMAL HIGH (ref 70–99)
Potassium: 3.7 mEq/L (ref 3.5–5.1)
Sodium: 141 mEq/L (ref 135–145)
Total Bilirubin: 1.2 mg/dL (ref 0.2–1.2)
Total Protein: 6.4 g/dL (ref 6.0–8.3)

## 2020-09-28 LAB — TSH: TSH: 2.11 u[IU]/mL (ref 0.35–4.50)

## 2020-09-28 NOTE — Addendum Note (Signed)
Addended by: Leeanne Rio on: 09/28/2020 09:59 AM   Modules accepted: Orders

## 2020-09-29 ENCOUNTER — Ambulatory Visit (INDEPENDENT_AMBULATORY_CARE_PROVIDER_SITE_OTHER): Payer: Medicare Other | Admitting: Pharmacist

## 2020-09-29 ENCOUNTER — Other Ambulatory Visit: Payer: Medicare Other

## 2020-09-29 DIAGNOSIS — E785 Hyperlipidemia, unspecified: Secondary | ICD-10-CM

## 2020-09-29 DIAGNOSIS — M810 Age-related osteoporosis without current pathological fracture: Secondary | ICD-10-CM

## 2020-09-29 DIAGNOSIS — J449 Chronic obstructive pulmonary disease, unspecified: Secondary | ICD-10-CM

## 2020-09-29 DIAGNOSIS — I739 Peripheral vascular disease, unspecified: Secondary | ICD-10-CM

## 2020-09-29 DIAGNOSIS — I10 Essential (primary) hypertension: Secondary | ICD-10-CM

## 2020-09-29 MED ORDER — EZETIMIBE 10 MG PO TABS: 10.0000 mg | ORAL_TABLET | Freq: Every day | ORAL | 2 refills | Status: DC

## 2020-09-29 NOTE — Chronic Care Management (AMB) (Signed)
Chronic Care Management   Note  09/29/2020 Name: Kristen Bailey MRN: [id number] DOB: 1933-10-31   Subjective:  Kristen Bailey is a 86 y.o. year old female who is a primary care patient of Kristen Bailey, Kristen Spillers, Bailey. The CCM team was consulted for assistance with chronic disease management and care coordination needs.     Contacted patient for initial medication access and medication management support  [name] was given information about Chronic Care Management services today including:  1. CCM service includes personalized support from designated clinical staff supervised by her physician, including individualized plan of care and coordination with other care providers 2. 24/7 contact phone numbers for assistance for urgent and routine care needs. 3. Service will only be billed when office clinical staff spend 20 minutes or more in a month to coordinate care. 4. Only one practitioner may furnish and bill the service in a calendar month. 5. The patient may stop CCM services at any time (effective at the end of the month) by phone call to the office staff. 6. The patient will be responsible for cost sharing (co-pay) of up to 20% of the service fee (after annual deductible is met).  Patient agreed to services and verbal consent obtained.   Review of patient status, including review of consultants reports, laboratory and other test data, was performed as part of comprehensive evaluation and provision of chronic care management services.   SDOH (Social Determinants of Health) assessments and interventions performed:  SDOH Interventions     Most Recent Value  SDOH Interventions  Financial Strain Interventions Other (Comment)  [manufacturer assistance]       Objective:  Lab Results  Component Value Date   CREATININE 0.91 09/28/2020   CREATININE 0.91 02/23/2020   CREATININE 0.95 08/17/2019    Lab Results  Component Value Date   HGBA1C 5.6 08/17/2019         Component Value Date/Time   CHOL 245 (H) 09/28/2020 0913   TRIG 114.0 09/28/2020 0913   HDL 103.20 09/28/2020 0913   CHOLHDL 2 09/28/2020 0913   VLDL 22.8 09/28/2020 0913   LDLCALC 119 (H) 09/28/2020 0913    Clinical ASCVD: Yes  The ASCVD Risk score (Goff DC Jr., et al., 2013) failed to calculate for the following reasons:   The 2013 ASCVD risk score is only valid for ages 59 to 73    BP Readings from Last 3 Encounters:  03/22/20 106/70  01/18/20 (!) 159/84  08/03/19 120/60    Allergies  Allergen Reactions  . Trelegy Ellipta [Fluticasone-Umeclidin-Vilant]     Shortness of breath  . Codeine Nausea And Vomiting  . Lipitor [Atorvastatin] Nausea And Vomiting  . Omeprazole     unknown  . Vicodin [Hydrocodone-Acetaminophen]     unknown  . Zyban [Bupropion]     unknown  . Mevacor [Lovastatin] Itching and Rash    Medications Reviewed Today    Reviewed by Kristen Bailey, RPH-CPP (Pharmacist) on 09/29/20 at 1440  Med List Status: <None>  Medication Order Taking? Sig Documenting Provider Last Dose Status Informant  acetaminophen (TYLENOL) 500 MG tablet [id number] Yes Take 500 mg by mouth every 6 (six) hours as needed. Provider, Historical, Bailey Taking Active Self  albuterol (VENTOLIN HFA) 108 (90 Base) MCG/ACT inhaler [id number] Yes Inhale 1-2 puffs into the lungs every 6 (six) hours as needed for wheezing or shortness of breath. Provider, Historical, Bailey Taking Active   amLODipine (NORVASC) 5 MG tablet [id number] Yes Take 1 tablet (5 mg  total) by mouth daily. Kristen Bailey, Kristen Bailey Taking Active   budesonide-formoterol [facility]) 160-4.5 MCG/ACT inhaler [id number] Yes Inhale 2 puffs into the lungs 2 (two) times daily. Provider, Historical, Bailey Taking Active Self  calcium carbonate (OSCAL) 1500 (600 Ca) MG TABS tablet [id number] Yes Take 1,500 mg by mouth 2 (two) times daily with a meal.  Provider, Historical, Bailey Taking Active Self  Cholecalciferol (VITAMIN D-1000 MAX ST) 1000  units tablet [id number] Yes Take 1,000 Units by mouth daily.  Provider, Historical, Bailey Taking Active Self           Med Note Kristen Bailey, [facility]   Tue Jun 24, 2017 11:09 AM)    cholestyramine (QUESTRAN) 4 g packet [id number] Yes Take 1 packet (4 g total) by mouth daily before breakfast for 1 dose. As needed Kristen Bailey, Kristen Bailey Taking Active   fluticasone (FLONASE) 50 MCG/ACT nasal spray [id number] Yes Place into the nose daily as needed.  Provider, Historical, Bailey Taking Active   hydrocortisone 2.5 % ointment [id number] Yes Apply topically 2 (two) times daily. As needed to skin Kristen Bailey, Kristen Bailey Taking Active   hydrOXYzine (ATARAX/VISTARIL) 25 MG tablet [id number] No Take 1 tablet (25 mg total) by mouth 2 (two) times daily as needed. Try at night first. Please deliver. Stop allegra  Patient not taking: Reported on 09/29/2020   Kristen Bailey, Kristen Bailey Not Taking Active   lisinopril-hydrochlorothiazide (ZESTORETIC) 10-12.5 MG tablet [id number] Yes Take 1 tablet by mouth daily.  Provider, Historical, Bailey Taking Active Self           Med Note Kristen Bailey, Kristen Bailey   Tue Jun 24, 2017 11:09 AM)    Kristen Bailey (ATIVAN) 1 MG tablet [id number] Yes Take 1 mg by mouth 2 (two) times daily.  Provider, Historical, Bailey Taking Active Self           Med Note Kristen Bailey, Kristen Bailey   Fri Sep 29, 2020  2:38 PM) Taking some mornings, but every evening  Magnesium 400 MG TABS [id number] Yes Take by mouth daily. Provider, Historical, Bailey Taking Active   montelukast (SINGULAIR) 10 MG tablet [id number] Yes TAKE 1 TABLET BY MOUTH AT BEDTIME Ralene Bathe, Bailey Taking Active   Omega-3 Fatty Acids (FISH OIL) 1000 MG CAPS [id number] Yes Take 1 capsule by mouth daily. Provider, Historical, Bailey Taking Active   ondansetron (ZOFRAN) 4 MG tablet [id number] No Take 1 tablet (4 mg total) by mouth every 8 (eight) hours as needed for nausea or vomiting.  Patient not taking: Reported on 09/29/2020   Kristen Bailey, Kristen Bailey  Not Taking Active   potassium chloride SA (K-DUR) 20 MEQ tablet [id number] Yes Take 1 tablet (20 mEq total) by mouth daily. Kristen Bailey, Kristen Bailey Taking Active   tiotropium [facility]) 18 MCG inhalation capsule [id number] Yes Place 18 mcg into inhaler and inhale daily. Provider, Historical, Bailey Taking Active Self           Assessment:   Goals Addressed              This Visit's Progress     Patient Stated   .  PharmD "I need help affording my medications" (pt-stated)        CARE PLAN ENTRY (see longitudinal plan of care for additional care plan information)  Current Barriers:  . Social, financial, community barriers:  o Reports cost concerns - will have a difficult time affording Prolia + Symbicort and Spiriva.  o Lives alone at an independent living facility.  Marland Kitchen  Polypharmacy; complex patient with multiple comorbidities including severe COPD, HTN, osteoporosis . Most recent eGFR: ~57 mL/min . COPD: Dr. Raul Del; Symbicort 160/4.5 mcg 2 puffs BID, Spiriva 18 mcg daily, albuterol HFA PRN, levalbuterol nebulizer PRN, using either about once daily  o Trelegy is documented as an allergy, but noted in pulmonology notes that it was a cost concern. Appears there was an ED visit on 04/2019 where Raul Del had just consolidated Spiriva + Symbicort to Trelegy, but patient was anxious about this new medication working and was SOB, went to ED.  . Osteoporosis: Vitamin D, calcium carbonate, Prolia Q15months- is interested in starting Prolia, would like PCP to know this.  Marland Kitchen HTN: Dr. Humphrey Rolls; lisinopril/HCTZ 10/12.5 mg QAM, amlodipine 5 mg daily, last clinic BP at goal . ASCVD: LDL not at goal, 116. Hx statin intolerance to atorvastatin, lovastatin d/t itching, rash, GI tolerability. PCP suggested ezetimibe on lab work today . Dermatologic conditions/allergies: Dr. Nehemiah Massed: fexofendine 180 mg daily, montelukast 10 mg daily, hydroxyzine 10 mg PRN,  . Supplements: Magnesium, potassium   Pharmacist  Clinical Goal(s):  Marland Kitchen Over the next 90 days, patient will work with PharmD and provider towards optimized medication management  Interventions: . Comprehensive medication review performed; medication list updated in electronic medical record . Inter-disciplinary care team collaboration (see longitudinal plan of care) . Reviewed lab work with patient per PCP note. Patient amenable to ezetimibe. Start ezetimibe 10 mg daily. Script sent to [employer]. . Discussed patient assistance program availability for inhalers. D/t hx of Trelegy, though unsure if truly an allergy, will avoid. Patient would qualify for [facility] assistance. Contacted Dr. Raul Del, left message asking if he would be OK with switch and patient assistance. Once we hear back, will plan to  . Reviewed indications for each medication. Patient verbalizes understanding  Patient Self Care Activities:  . Patient will take medications as prescribed . Patient will collaborate w/ team on medication assistance  Initial goal documentation        Plan: - Will await call back from pulmonology. Will outreach for f/u next week if I have not heard back.   Catie Kristen Bailey, PharmD, [place], CPP Clinical Pharmacist [place] [phone]

## 2020-09-29 NOTE — Patient Instructions (Signed)
Visit Information  Goals Addressed              This Visit's Progress     Patient Stated   .  PharmD "I need help affording my medications" (pt-stated)        CARE PLAN ENTRY (see longitudinal plan of care for additional care plan information)  Current Barriers:  . Social, financial, community barriers:  o Reports cost concerns - will have a difficult time affording Prolia + Symbicort and Spiriva.  o Lives alone at an independent living facility.  . Polypharmacy; complex patient with multiple comorbidities including severe COPD, HTN, osteoporosis . Most recent eGFR: ~57 mL/min . COPD: Dr. Raul Del; Symbicort 160/4.5 mcg 2 puffs BID, Spiriva 18 mcg daily, albuterol HFA PRN, levalbuterol nebulizer PRN, using either about once daily  o Trelegy is documented as an allergy, but noted in pulmonology notes that it was a cost concern. Appears there was an ED visit on 04/2019 where Raul Del had just consolidated Spiriva + Symbicort to Trelegy, but patient was anxious about this new medication working and was SOB, went to ED.  . Osteoporosis: Vitamin D, calcium carbonate, Prolia Q94months- is interested in starting Prolia, would like PCP to know this.  Marland Kitchen HTN: Dr. Humphrey Rolls; lisinopril/HCTZ 10/12.5 mg QAM, amlodipine 5 mg daily, last clinic BP at goal . ASCVD: LDL not at goal, 116. Hx statin intolerance to atorvastatin, lovastatin d/t itching, rash, GI tolerability. PCP suggested ezetimibe on lab work today . Dermatologic conditions/allergies: Dr. Nehemiah Massed: fexofendine 180 mg daily, montelukast 10 mg daily, hydroxyzine 10 mg PRN,  . Supplements: Magnesium, potassium   Pharmacist Clinical Goal(s):  Marland Kitchen Over the next 90 days, patient will work with PharmD and provider towards optimized medication management  Interventions: . Comprehensive medication review performed; medication list updated in electronic medical record . Inter-disciplinary care team collaboration (see longitudinal plan of care) . Reviewed  lab work with patient per PCP note. Patient amenable to ezetimibe. Start ezetimibe 10 mg daily. Script sent to [employer]. . Discussed patient assistance program availability for inhalers. D/t hx of Trelegy, though unsure if truly an allergy, will avoid. Patient would qualify for [facility] assistance. Contacted Dr. Raul Del, left message asking if he would be OK with switch and patient assistance. Once we hear back, will plan to  . Reviewed indications for each medication. Patient verbalizes understanding  Patient Self Care Activities:  . Patient will take medications as prescribed . Patient will collaborate w/ team on medication assistance  Initial goal documentation        Kristen Bailey was given information about Chronic Care Management services today including:  1. CCM service includes personalized support from designated clinical staff supervised by her physician, including individualized plan of care and coordination with other care providers 2. 24/7 contact phone numbers for assistance for urgent and routine care needs. 3. Service will only be billed when office clinical staff spend 20 minutes or more in a month to coordinate care. 4. Only one practitioner may furnish and bill the service in a calendar month. 5. The patient may stop CCM services at any time (effective at the end of the month) by phone call to the office staff. 6. The patient will be responsible for cost sharing (co-pay) of up to 20% of the service fee (after annual deductible is met).  Patient agreed to services and verbal consent obtained.   The patient verbalized understanding of instructions provided today and declined a print copy of patient instruction materials.  Plan: - Will await call back from pulmonology. Will outreach for f/u next week if I have not heard back.   Catie Darnelle Maffucci, PharmD, [place], CPP Clinical Pharmacist [place] [phone]

## 2020-10-03 ENCOUNTER — Ambulatory Visit: Payer: Medicare Other | Admitting: Pharmacist

## 2020-10-03 DIAGNOSIS — I10 Essential (primary) hypertension: Secondary | ICD-10-CM | POA: Diagnosis not present

## 2020-10-03 DIAGNOSIS — E785 Hyperlipidemia, unspecified: Secondary | ICD-10-CM

## 2020-10-03 DIAGNOSIS — J449 Chronic obstructive pulmonary disease, unspecified: Secondary | ICD-10-CM

## 2020-10-03 DIAGNOSIS — M810 Age-related osteoporosis without current pathological fracture: Secondary | ICD-10-CM

## 2020-10-03 DIAGNOSIS — I739 Peripheral vascular disease, unspecified: Secondary | ICD-10-CM

## 2020-10-03 NOTE — Patient Instructions (Signed)
Visit Information  Goals Addressed              This Visit's Progress     Patient Stated   .  PharmD "I need help affording my medications" (pt-stated)        CARE PLAN ENTRY (see longitudinal plan of care for additional care plan information)  Current Barriers:  . Social, financial, community barriers:  o Dr. Raul Del OK with patient assistance for [place] . Polypharmacy; complex patient with multiple comorbidities including severe COPD, HTN, osteoporosis . Most recent eGFR: ~57 mL/min . COPD: Dr. Raul Del; Symbicort 160/4.5 mcg 2 puffs BID, Spiriva 18 mcg daily, albuterol HFA PRN, levalbuterol nebulizer PRN, using either about once daily - pursuing [employer] assistance  . Osteoporosis: Vitamin D, calcium carbonate, Prolia Q57months- is interested in starting Prolia, would like PCP to know this.  Marland Kitchen HTN: Dr. Humphrey Rolls; lisinopril/HCTZ 10/12.5 mg QAM, amlodipine 5 mg daily, last clinic BP at goal . ASCVD: Ezetimibe 10 mg daily, LDL not at goal, 116. Hx statin intolerance to atorvastatin, lovastatin d/t itching, rash, GI tolerability. . Dermatologic conditions/allergies: Dr. Nehemiah Massed: fexofendine 180 mg daily, montelukast 10 mg daily, hydroxyzine 10 mg PRN,  . Supplements: Magnesium, potassium   Pharmacist Clinical Goal(s):  Marland Kitchen Over the next 90 days, patient will work with PharmD and provider towards optimized medication management  Interventions: . Will collaborate w/ PCP for prescriber portion of [employer] application. Will collaborate w/ CPhT to mail patient her portion of application  Patient Self Care Activities:  . Patient will take medications as prescribed . Patient will collaborate w/ team on medication assistance  Please see past updates related to this goal by clicking on the "Past Updates" button in the selected goal         The patient verbalized understanding of instructions provided today and declined a print copy of patient instruction materials.   Plan:  - Will  collaborate w/ patient, provider, and CPhT as above.  - Scheduled f/u call in ~ 6 weeks  Catie Darnelle Maffucci, PharmD, [place], [place] Pharmacist [place] [phone]

## 2020-10-03 NOTE — Chronic Care Management (AMB) (Signed)
Chronic Care Management   Follow Up Note   10/03/2020 Name: Kristen Bailey MRN: [id number] DOB: 03/13/1933  Referred by: McLean-Scocuzza, Nino Glow, MD Reason for referral : Chronic Care Management (Medication Management)   Kristen Bailey is a 86 y.o. year old female who is a primary care patient of McLean-Scocuzza, Nino Glow, MD. The CCM team was consulted for assistance with chronic disease management and care coordination needs.    Care coordination completed today   Review of patient status, including review of consultants reports, relevant laboratory and other test results, and collaboration with appropriate care team members and the patient's provider was performed as part of comprehensive patient evaluation and provision of chronic care management services.    SDOH (Social Determinants of Health) assessments performed: Yes See Care Plan activities for detailed interventions related to [facility])     Outpatient Encounter Medications as of 10/03/2020  Medication Sig Note  . acetaminophen (TYLENOL) 500 MG tablet Take 500 mg by mouth every 6 (six) hours as needed.   Marland Kitchen albuterol (VENTOLIN HFA) 108 (90 Base) MCG/ACT inhaler Inhale 1-2 puffs into the lungs every 6 (six) hours as needed for wheezing or shortness of breath.   Marland Kitchen amLODipine (NORVASC) 5 MG tablet Take 1 tablet (5 mg total) by mouth daily.   . budesonide-formoterol (SYMBICORT) 160-4.5 MCG/ACT inhaler Inhale 2 puffs into the lungs 2 (two) times daily.   . calcium carbonate (OSCAL) 1500 (600 Ca) MG TABS tablet Take 1,500 mg by mouth 2 (two) times daily with a meal.    . Cholecalciferol (VITAMIN D-1000 MAX ST) 1000 units tablet Take 1,000 Units by mouth daily.    . cholestyramine (QUESTRAN) 4 g packet Take 1 packet (4 g total) by mouth daily before breakfast for 1 dose. As needed   . ezetimibe (ZETIA) 10 MG tablet Take 1 tablet (10 mg total) by mouth daily.   . fluticasone (FLONASE) 50 MCG/ACT nasal spray Place into the  nose daily as needed.    . hydrocortisone 2.5 % ointment Apply topically 2 (two) times daily. As needed to skin   . hydrOXYzine (ATARAX/VISTARIL) 25 MG tablet Take 1 tablet (25 mg total) by mouth 2 (two) times daily as needed. Try at night first. Please deliver. Stop allegra (Patient not taking: Reported on 09/29/2020)   . levalbuterol (XOPENEX) 0.63 MG/3ML nebulizer solution Take 0.63 mg by nebulization every 4 (four) hours as needed for wheezing or shortness of breath.   . lisinopril-hydrochlorothiazide (ZESTORETIC) 10-12.5 MG tablet Take 1 tablet by mouth daily.    Marland Kitchen LORazepam (ATIVAN) 1 MG tablet Take 1 mg by mouth 2 (two) times daily.  09/29/2020: Taking some mornings, but every evening  . Magnesium 400 MG TABS Take by mouth daily.   . montelukast (SINGULAIR) 10 MG tablet TAKE 1 TABLET BY MOUTH AT BEDTIME   . Omega-3 Fatty Acids (FISH OIL) 1000 MG CAPS Take 1 capsule by mouth daily.   . ondansetron (ZOFRAN) 4 MG tablet Take 1 tablet (4 mg total) by mouth every 8 (eight) hours as needed for nausea or vomiting. (Patient not taking: Reported on 09/29/2020)   . potassium chloride SA (K-DUR) 20 MEQ tablet Take 1 tablet (20 mEq total) by mouth daily.   Marland Kitchen tiotropium (SPIRIVA) 18 MCG inhalation capsule Place 18 mcg into inhaler and inhale daily.    No facility-administered encounter medications on file as of 10/03/2020.     Objective:   Goals Addressed  This Visit's Progress     Patient Stated   .  PharmD "I need help affording my medications" (pt-stated)        CARE PLAN ENTRY (see longitudinal plan of care for additional care plan information)  Current Barriers:  . Social, financial, community barriers:  o Dr. Raul Del [place] with patient assistance for [place] . Polypharmacy; complex patient with multiple comorbidities including severe COPD, HTN, osteoporosis . Most recent eGFR: ~57 mL/min . COPD: Dr. Raul Del; Symbicort 160/4.5 mcg 2 puffs BID, Spiriva 18 mcg daily,  albuterol HFA PRN, levalbuterol nebulizer PRN, using either about once daily - pursuing Breztri assistance  . Osteoporosis: Vitamin D, calcium carbonate, Prolia Q24months- is interested in starting Prolia, would like PCP to know this.  Marland Kitchen HTN: Dr. Humphrey Rolls; lisinopril/HCTZ 10/12.5 mg QAM, amlodipine 5 mg daily, last clinic BP at goal . ASCVD: Ezetimibe 10 mg daily, LDL not at goal, 116. Hx statin intolerance to atorvastatin, lovastatin d/t itching, rash, GI tolerability. . Dermatologic conditions/allergies: Dr. Nehemiah Massed: fexofendine 180 mg daily, montelukast 10 mg daily, hydroxyzine 10 mg PRN,  . Supplements: Magnesium, potassium   Pharmacist Clinical Goal(s):  Marland Kitchen Over the next 90 days, patient will work with PharmD and provider towards optimized medication management  Interventions: . Will collaborate w/ PCP for prescriber portion of Breztri application. Will collaborate w/ CPhT to mail patient her portion of application  Patient Self Care Activities:  . Patient will take medications as prescribed . Patient will collaborate w/ team on medication assistance  Please see past updates related to this goal by clicking on the "Past Updates" button in the selected goal          Plan:  - Will collaborate w/ patient, provider, and CPhT as above.  - Scheduled f/u call in ~ 6 weeks  Catie Darnelle Maffucci, PharmD, [place], [place] Pharmacist [place] [phone]

## 2020-10-05 ENCOUNTER — Ambulatory Visit
Admission: RE | Admit: 2020-10-05 | Discharge: 2020-10-05 | Disposition: A | Discharge status: Home / Self Care | Payer: Medicare Other | Source: Ambulatory Visit | Attending: Internal Medicine | Admitting: Internal Medicine

## 2020-10-05 ENCOUNTER — Other Ambulatory Visit: Payer: Self-pay

## 2020-10-05 DIAGNOSIS — R928 Other abnormal and inconclusive findings on diagnostic imaging of breast: Secondary | ICD-10-CM | POA: Diagnosis not present

## 2020-10-05 DIAGNOSIS — R921 Mammographic calcification found on diagnostic imaging of breast: Secondary | ICD-10-CM

## 2020-10-05 DIAGNOSIS — D0511 Intraductal carcinoma in situ of right breast: Secondary | ICD-10-CM | POA: Diagnosis not present

## 2020-10-05 HISTORY — PX: BREAST BIOPSY: SHX20

## 2020-10-06 ENCOUNTER — Other Ambulatory Visit: Payer: Self-pay | Admitting: Internal Medicine

## 2020-10-06 ENCOUNTER — Encounter: Payer: Self-pay | Admitting: Internal Medicine

## 2020-10-06 DIAGNOSIS — D0511 Intraductal carcinoma in situ of right breast: Secondary | ICD-10-CM

## 2020-10-06 NOTE — Progress Notes (Signed)
Initiated navigation.  Scheduled with Dr. Grayland Ormond on 10/09/20.  Surgery offices are closed.  Will call Monday to schedule consult .

## 2020-10-09 ENCOUNTER — Other Ambulatory Visit: Payer: Self-pay

## 2020-10-09 ENCOUNTER — Inpatient Hospital Stay: Payer: Medicare Other | Attending: Oncology | Admitting: Oncology

## 2020-10-09 ENCOUNTER — Inpatient Hospital Stay: Payer: Medicare Other

## 2020-10-09 ENCOUNTER — Encounter: Payer: Self-pay | Admitting: Oncology

## 2020-10-09 VITALS — BP 112/65 | HR 67 | Temp 98.2°F | Wt 131.7 lb

## 2020-10-09 DIAGNOSIS — G250 Essential tremor: Secondary | ICD-10-CM | POA: Diagnosis not present

## 2020-10-09 DIAGNOSIS — D0511 Intraductal carcinoma in situ of right breast: Secondary | ICD-10-CM | POA: Diagnosis not present

## 2020-10-09 DIAGNOSIS — Z79899 Other long term (current) drug therapy: Secondary | ICD-10-CM | POA: Insufficient documentation

## 2020-10-09 DIAGNOSIS — Z85828 Personal history of other malignant neoplasm of skin: Secondary | ICD-10-CM | POA: Diagnosis not present

## 2020-10-09 DIAGNOSIS — J449 Chronic obstructive pulmonary disease, unspecified: Secondary | ICD-10-CM | POA: Insufficient documentation

## 2020-10-09 DIAGNOSIS — F419 Anxiety disorder, unspecified: Secondary | ICD-10-CM | POA: Insufficient documentation

## 2020-10-09 DIAGNOSIS — I10 Essential (primary) hypertension: Secondary | ICD-10-CM | POA: Insufficient documentation

## 2020-10-09 DIAGNOSIS — Z7951 Long term (current) use of inhaled steroids: Secondary | ICD-10-CM | POA: Insufficient documentation

## 2020-10-09 DIAGNOSIS — Z87891 Personal history of nicotine dependence: Secondary | ICD-10-CM | POA: Diagnosis not present

## 2020-10-09 DIAGNOSIS — Z170 Estrogen receptor positive status [ER+]: Secondary | ICD-10-CM | POA: Insufficient documentation

## 2020-10-09 NOTE — Progress Notes (Signed)
Supported patient ,and daughter at initial Med/Onc visit.  Scheduled her with Dr. Christian Mate 10/10/20 at 9:30.

## 2020-10-10 ENCOUNTER — Encounter: Payer: Self-pay | Admitting: Surgery

## 2020-10-10 ENCOUNTER — Other Ambulatory Visit: Payer: Self-pay

## 2020-10-10 ENCOUNTER — Ambulatory Visit: Payer: Medicare Other | Admitting: Surgery

## 2020-10-10 VITALS — BP 160/75 | HR 98 | Temp 97.9°F | Resp 14 | Ht 63.0 in | Wt 135.4 lb

## 2020-10-10 DIAGNOSIS — D0511 Intraductal carcinoma in situ of right breast: Secondary | ICD-10-CM

## 2020-10-10 DIAGNOSIS — Z9981 Dependence on supplemental oxygen: Secondary | ICD-10-CM | POA: Diagnosis not present

## 2020-10-10 NOTE — Progress Notes (Signed)
Patient ID: Kristen Bailey, female   DOB: 1933/05/09, 86 y.o.   MRN: [id number]  Chief Complaint: Right breast DCIS  History of Present Illness Kristen Bailey is a 86 y.o. female with calcifications noted on recent screening mammography.  Confirmed 1.4 cm area of calcs noted on diagnostic imaging.  Stereotactic biopsy obtained, confirming DCIS which is concordant with imaging.  Prognostic indicators were not completed, anticipating further excision.  Patient tolerated the above procedures well.  However she presents today with concerns shared with her daughter about her ability to tolerate surgery. She has no family history of breast cancer.  She had never felt a lump, had any discharge from her nipple, nor any skin changes nor breast pain.  Past Medical History Past Medical History:  Diagnosis Date  . Anxiety   . Chicken pox   . COPD (chronic obstructive pulmonary disease) ([place])   . Essential tremor   . Hypertension   . Hypomagnesemia   . Irritable bowel syndrome (IBS)   . 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 ABDOMINAL HYSTERECTOMY    . APPENDECTOMY    . BREAST BIOPSY Right 10/05/2020   stereo bx, x-clip, path pending   . CHOLECYSTECTOMY    . OVARIAN CYST REMOVAL    . SMALL INTESTINE SURGERY      Allergies  Allergen Reactions  . Trelegy Ellipta [Fluticasone-Umeclidin-Vilant]     Shortness of breath  . Codeine Nausea And Vomiting  . Lipitor [Atorvastatin] Nausea And Vomiting  . Omeprazole     unknown  . Vicodin [Hydrocodone-Acetaminophen]     unknown  . Zyban [Bupropion]     unknown  . Mevacor [Lovastatin] Itching and Rash    Current Outpatient Medications  Medication Sig Dispense Refill  . acetaminophen (TYLENOL) 500 MG tablet Take 500 mg by mouth every 6 (six) hours as needed.    Marland Kitchen albuterol (VENTOLIN HFA) 108 (90  Base) MCG/ACT inhaler Inhale 1-2 puffs into the lungs every 6 (six) hours as needed for wheezing or shortness of breath.    Marland Kitchen amLODipine (NORVASC) 5 MG tablet Take 1 tablet (5 mg total) by mouth daily. 90 tablet 3  . calcium carbonate (OSCAL) 1500 (600 Ca) MG TABS tablet Take 1,500 mg by mouth 2 (two) times daily with a meal.     . Cholecalciferol (VITAMIN D-1000 MAX ST) 1000 units tablet Take 1,000 Units by mouth daily.     Marland Kitchen ezetimibe (ZETIA) 10 MG tablet Take 1 tablet (10 mg total) by mouth daily. 30 tablet 2  . fluticasone (FLONASE) 50 MCG/ACT nasal spray Place into the nose daily as needed.     . hydrocortisone 2.5 % ointment Apply topically 2 (two) times daily. As needed to skin 60 g 2  . hydrOXYzine (ATARAX/VISTARIL) 25 MG tablet Take 1 tablet (25 mg total) by mouth 2 (two) times daily as needed. Try at night first. Please deliver. Stop allegra 60 tablet 2  . levalbuterol (XOPENEX) 0.63 MG/3ML nebulizer solution Take 0.63 mg by nebulization every 4 (four) hours as needed for wheezing or shortness of breath.    Marland Kitchen LORazepam (ATIVAN) 1 MG tablet Take 1 mg by mouth 2 (two) times daily.     . Magnesium 400 MG TABS Take by mouth daily.    . montelukast (SINGULAIR) 10 MG tablet TAKE 1 TABLET BY  MOUTH AT BEDTIME 30 tablet 1  . Omega-3 Fatty Acids (FISH OIL) 1000 MG CAPS Take 1 capsule by mouth daily.    . ondansetron (ZOFRAN) 4 MG tablet Take 1 tablet (4 mg total) by mouth every 8 (eight) hours as needed for nausea or vomiting. 90 tablet 1  . potassium chloride SA (K-DUR) 20 MEQ tablet Take 1 tablet (20 mEq total) by mouth daily. 90 tablet 3  . cholestyramine (QUESTRAN) 4 g packet Take 1 packet (4 g total) by mouth daily before breakfast for 1 dose. As needed 60 each 5  . lisinopril-hydrochlorothiazide (ZESTORETIC) 10-12.5 MG tablet Take 1 tablet by mouth daily.      No current facility-administered medications for this visit.    Family History Family History  Problem Relation Age of Onset  .  Heart attack Mother   . Heart disease Mother   . Hypertension Mother   . Cancer Father        bladder smoker  . Heart disease Brother        MI in 28s   . Breast cancer Neg Hx       Social History Social History   Tobacco Use  . Smoking status: Former Research scientist (life sciences)  . Smokeless tobacco: Never Used  Substance Use Topics  . Alcohol use: No  . Drug use: No        Review of Systems  Constitutional: Negative.   HENT: Positive for hearing loss.   Eyes: Negative.   Respiratory: Positive for shortness of breath and wheezing.   Cardiovascular: Positive for claudication and leg swelling.  Gastrointestinal: Positive for nausea.  Genitourinary: Negative.   Skin: Positive for itching.  Neurological: Negative.   Psychiatric/Behavioral: Negative.       Physical Exam Blood pressure (!) 160/75, pulse 98, temperature 97.9 F (36.6 C), resp. rate 14, height 5\' 3"  (1.6 m), weight 135 lb 6.4 oz (61.4 kg), SpO2 91 %. Last Weight  Most recent update: 10/10/2020  9:33 AM   Weight  61.4 kg (135 lb 6.4 oz)            CONSTITUTIONAL: Frail, and elderly pleasant lady who presents with her daughter, appropriately responsive and aware without distress. Who brings her supplemental O2.   EYES: Sclera non-icteric.   EARS, NOSE, MOUTH AND THROAT: Mask worn.    Hearing is intact to voice.  NECK: Trachea is midline, and there is no jugular venous distension.  LYMPH NODES:  Lymph nodes in the neck are not enlarged. RESPIRATORY:  Lungs are clear, and breath sounds are equal bilaterally. Normal respiratory effort without pathologic use of accessory muscles. CARDIOVASCULAR: Heart is regular in rate and rhythm. MUSCULOSKELETAL: Evidence of arthritic changes noted in her hands.  Generalized cachexia.    SKIN: Skin turgor is normal. No pathologic skin lesions appreciated.  NEUROLOGIC:  Motor and cranial nerves are grossly nonfocal. PSYCH:  Alert and oriented to person, place and time. Affect is appropriate  for situation.  Data Reviewed I have personally reviewed what is currently available of the patient's imaging, recent labs and medical records.   Labs:  CBC Latest Ref Rng & Units 09/28/2020 02/23/2020 05/05/2019  WBC 4.0 - 10.5 K/uL 5.6 5.9 6.8  Hemoglobin 12.0 - 15.0 g/dL 13.2 12.6 12.1  Hematocrit 36 - 46 % 39.8 37.8 38.2  Platelets 150 - 400 K/uL 269.0 292.0 222   CMP Latest Ref Rng & Units 09/28/2020 02/23/2020 08/17/2019  Glucose 70 - 99 mg/dL 111(H) 109(H) 102(H)  BUN  6 - 23 mg/dL 25(H) 27(H) 31(H)  Creatinine 0.40 - 1.20 mg/dL 0.91 0.91 0.95  Sodium 135 - 145 mEq/L 141 139 143  Potassium 3.5 - 5.1 mEq/L 3.7 3.6 3.8  Chloride 96 - 112 mEq/L 99 101 101  CO2 19 - 32 mEq/L 32 33(H) 33(H)  Calcium 8.4 - 10.5 mg/dL 9.3 9.5 9.5  Total Protein 6.0 - 8.3 g/dL 6.4 6.6 6.2  Total Bilirubin 0.2 - 1.2 mg/dL 1.2 1.0 1.1  Alkaline Phos 39 - 117 U/L 53 52 50  AST 0 - 37 U/L 15 15 13   ALT 0 - 35 U/L 8 10 7    SURGICAL PATHOLOGY  CASE: [id number]  PATIENT: [place] Kristen Bailey  Surgical Pathology Report      Specimen Submitted:  A. Right breast outer; stereo bx   Clinical History: 86 yo f with 1.4 cm group of outer right breast  calcifications. Coil-shaped clip placed following stereotactic biopsy of  RIGHT breast, outer. Residual calcifications extend 1.5 cm anterior to  clip.     DIAGNOSIS:  A. BREAST, RIGHT, OUTER; STEREOTACTIC CORE NEEDLE BIOPSY:  - DUCTAL CARCINOMA IN SITU, HIGH-GRADE WITH COMEDONECROSIS, WITH  ASSOCIATED CALCIFICATIONS.  - NEGATIVE FOR INVASIVE MAMMARY CARCINOMA.   Comment:  DCIS is present in 2 of 3 tissue blocks, and spans a distance of at  least 2 mm in greatest extent.   Ancillary estrogen receptor testing will be deferred to the final  resection.   GROSS DESCRIPTION:  A. Labeled: Right breast stereo biopsy calcifications outer  Received: in a formalin-filled [place] collection device  Specimen radiograph image(s) available for review  Time/Date in  fixative: 1:22 PM on 10/05/2020  Cold ischemic time: 2 minutes  Total fixation time: 6-1/2 hours  Core pieces: Multiple  Measurement: 1.5 x 1.5 x 0.7 cm  Description / comments: Aggregate of pale yellow cylindrically shaped  fibrofatty tissue fragments in the A-D sections of the device with  calcifications marked off in the B and D sections  Inked: Black  Entirely submitted in cassette(s):  1-section B  2-section D  3-section A, C   Final Diagnosis performed by Allena Napoleon, MD.  Electronically signed  10/06/2020 10:13:44AM  The electronic signature indicates that the named Attending Pathologist  has evaluated the specimen  Technical component performed at [place], [address], [place],  [place] [address] Lab: [phone] Dir: Rush Farmer, MD, MMM  Professional component performed at [facility], [facility], [place], [place], [place] [address] Lab: [phone]  Dir: Dellia Nims. Reuel Derby, MD    Imaging: Radiology review:   CLINICAL DATA:  Screening.  EXAM: DIGITAL SCREENING BILATERAL MAMMOGRAM WITH TOMO AND CAD  COMPARISON:  Previous exam(s).  ACR Breast Density Category b: There are scattered areas of fibroglandular density.  FINDINGS: In the right breast, calcifications warrant further evaluation with magnified views. They are seen in the outer breast at posterior depth. In the left breast, no findings suspicious for malignancy. Images were processed with CAD.  IMPRESSION: Further evaluation is suggested for calcifications in the right breast.  RECOMMENDATION: Diagnostic mammogram of the right breast. (Code:[id number])  The patient will be contacted regarding the findings, and additional imaging will be scheduled.  BI-RADS CATEGORY  0: Incomplete. Need additional imaging evaluation and/or prior mammograms for comparison.   Electronically Signed   By: Valentino Saxon MD   On: 08/25/2020 11:18  CLINICAL DATA:  The  patient was called back for right breast calcifications.  EXAM: DIGITAL DIAGNOSTIC RIGHT MAMMOGRAM  COMPARISON:  Previous exam(s).  ACR Breast Density Category b: There are scattered areas of fibroglandular density.  FINDINGS: There is a new group of calcifications in the lateral central right breast at a posterior depth spanning 1.4 cm which are indeterminate.  IMPRESSION: Indeterminate new right breast calcifications.  RECOMMENDATION: Recommend stereotactic biopsy of the new right breast calcifications.  I have discussed the findings and recommendations with the patient. If applicable, a reminder letter will be sent to the patient regarding the next appointment.  BI-RADS CATEGORY  4: Suspicious.   Electronically Signed   By: Dorise Bullion III M.D   On: 09/21/2020 14:26  ADDENDUM REPORT: 10/06/2020 12:47  ADDENDUM: PATHOLOGY revealed: A. BREAST, RIGHT, OUTER; STEREOTACTIC CORE NEEDLE BIOPSY: - DUCTAL CARCINOMA IN SITU, HIGH-GRADE WITH COMEDONECROSIS, WITH ASSOCIATED CALCIFICATIONS. - NEGATIVE FOR INVASIVE MAMMARY CARCINOMA. Comment: DCIS is present in 2 of 3 tissue blocks, and spans a distance of at least 2 mm in greatest extent.  Pathology results are CONCORDANT with imaging findings, per Dr. Hassan Rowan.  Pathology results and recommendations below were discussed with patient by telephone on 10/06/2020. Patient reported biopsy site within normal limits with slight tenderness at the site. Post biopsy care instructions were reviewed, questions were answered and my direct phone number was provided to patient. Patient was instructed to call [facility] if any concerns or questions arise related to the biopsy.  Recommend 1. Surgical consultation: Request for surgical consultation relayed to Al Pimple RN and Tanya Nones RN at [facility] by Electa Sniff RN on 10/06/2020.  2.  Consider bilateral breast MRI due to  high grade DCIS.  Pathology results reported by Electa Sniff RN on 10/06/2020.   Electronically Signed   By: Margarette Canada M.D.   On: 10/06/2020 12:47 Assessment     Patient Active Problem List   Diagnosis Date Noted  . Supplemental oxygen dependent 10/10/2020  . 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 Osteoporosis 09/13/2020  . Hives 04/11/2020  . IBS (irritable bowel syndrome) 08/04/2019  . Hemorrhoids 08/04/2019  . Anxiety 08/04/2019  . Leg cramps 08/04/2019  . Hypomagnesemia 08/04/2019  . Nausea 08/04/2019  . Hypokalemia 08/04/2019  . PAD (peripheral artery disease) ([place]) 11/22/2016  . Pain in limb 11/22/2016  . COPD (chronic obstructive pulmonary disease) ([place]) 11/22/2016  . Hyperlipidemia 11/22/2016  . Essential hypertension 11/22/2016    Plan    Considering her overall frailty, and O2 dependency.  I am quite concerned that the risks of surgical excision of this area of DCIS, to be greater than the cancers threat to her life at this time.  We do not know her ER status.  There is no evidence of invasion at this point in time.  I believe both daughter and patient are relieved to not pursue surgery at this point.  Hopefully there can be some risk diminishment obtained with less risk.  Face-to-face time spent with the patient and accompanying care providers(if present) was 50 minutes, with more than 50% of the time spent counseling, educating, and coordinating care of the patient.      Ronny Bacon M.D., FACS 10/10/2020, 12:23 PM

## 2020-10-10 NOTE — Patient Instructions (Addendum)
Our office will contact you in December 2021 to schedule a follow up visit for January 2022.   Call the office if you have any questions or concerns.  Ductal Carcinoma In Situ  Ductal carcinoma in situ is the presence of abnormal cells in the breast. It is the earliest form of breast cancer. The abnormal cells are located only in the tubes that carry milk to the nipple (milk ducts) and have not spread to other areas. What are the causes? The exact cause of ductal carcinoma in situ is not known. What increases the risk? The following factors increase the risk of developing ductal carcinoma in situ:  Being older than 84 years of age.  Being female.  Having a family history of breast cancer.  Current or past hormone use, such as: ? Using birth control. ? Taking hormone therapy after menopause.  Starting menopause after age 40.  A personal history of: ? Breast cancer. ? Dense breasts. ? Radiation treatments to the breasts or chest area. ? Having the BRCA1 and BRCA2 genes.  Drinking more than 1 alcoholic beverage a day.  Starting your menstrual periods before age 64.  Having never been pregnant or having your first child after age 61.  Having never breastfed.  Having an inactive (sedentary) lifestyle.  Exposure to the drug DES, which was given to pregnant women from the 1940s to the 1970s. What are the signs or symptoms? Ductal carcinoma in situ does not cause any symptoms. How is this diagnosed? Ductal carcinoma in situ is usually discovered during a routine X-ray of the breasts to check for abnormal changes (mammogram). To diagnose the condition, your health care provider may do an ultrasound and remove a tissue sample from your breast so it can be examined under a microscope (breast biopsy). Your health care provider may also remove one or more lymph nodes from under your arm to check if the abnormal cells have spread to your lymph nodes (sentinel lymph node biopsy). Lymph  nodes are part of the body's disease-fighting (immune) system. They are located throughout the body. The lymph nodes under the arms are usually the first place where abnormal cells spread. How is this treated? Ductal carcinoma in situ treatment may include:  A lumpectomy. This is surgery to remove the area of abnormal cells, along with a ring of normal tissue. This may also be called breast-conserving surgery.  Simple mastectomy. This is surgery to remove breast tissue, the nipple, and the circle of colored tissue around the nipple (areola). Sometimes, one or more lymph nodes from under the arm are also removed and tested for cancer cells.  Preventive mastectomy. This is the removal of both breasts. This is usually done only if you have a very high risk of developing breast cancer.  Radiation. This is the use of high-energy rays to kill cancer cells.  Medicines (hormone therapy) to keep the abnormal cells from spreading. Follow these instructions at home:  Take over-the-counter and prescription medicines only as told by your health care provider.  Eat a healthy diet. A healthy diet includes lots of fruits and vegetables, low-fat dairy products, lean meats, and fiber. ? Make sure half your plate is filled with fruits or vegetables. ? Choose high-fiber foods such as whole-grain breads and cereals.  Limit alcohol intake to no more than 1 drink a day for women (no drinks if you are pregnant) and 2 drinks a day for men. One drink equals 12 oz of beer, 5 oz of wine,  or 1 oz of hard liquor.  Do not use any products that contain nicotine or tobacco, such as cigarettes and e-cigarettes. If you need help quitting, ask your health care provider.  Keep all follow-up visits as told by your health care provider. This is important. Where to find more information  American Cancer Society: www.cancer.[place]: www.cancer.gov Contact a health care provider if:  You have a  fever.  You notice a new lump in either breast or under your arm.  You have any symptoms or changes that concern you. Get help right away if:  You have chest pain or trouble breathing. Summary  Ductal carcinoma in situ is the presence of abnormal cells in the breast. It is the earliest form of breast cancer.  The exact cause of ductal carcinoma in situ is not known. The risk increases with age and with current or past hormone use.  To diagnose the condition, your health care provider will remove a tissue sample from your breast so it can be examined under a microscope (breast biopsy). This information is not intended to replace advice given to you by your health care provider. Make sure you discuss any questions you have with your health care provider. Document Revised: 11/14/2017 Document Reviewed: 09/09/2017 Elsevier Patient Education  2020 [employer].

## 2020-10-10 NOTE — Progress Notes (Signed)
[place]  Telephone:(336) [phone] Fax:(336) [phone]  ID: Kristen Bailey OB: 10-05-1933  MR#: [id number]  KCL#:[id number]  Patient Care Team: McLean-Scocuzza, Nino Glow, MD as PCP - General (Internal Medicine) De Hollingshead, RPH-CPP (Pharmacist) Theodore Demark, RN as Oncology Nurse Navigator Grayland Ormond, Kathlene November, MD as Consulting Physician (Oncology)  CHIEF COMPLAINT: DCIS, right breast.  INTERVAL HISTORY: Patient is an 86 year old female who was noted to have calcifications on routine screening mammogram.  Subsequent ultrasound biopsy revealed DCIS.  No invasive component was noted.  She has chronic shortness of breath requiring oxygen, but otherwise feels well.  She has no neurologic complaints.  She denies any recent fevers or illnesses.  She has good appetite and denies weight loss.  She has no chest pain, cough, or hemoptysis.  She denies any nausea, vomiting, constipation, or diarrhea.  She has no urinary complaints.  Patient feels at her baseline offers no further specific complaints today.  REVIEW OF SYSTEMS:   Review of Systems  Constitutional: Negative.  Negative for fever, malaise/fatigue and weight loss.  Respiratory: Positive for shortness of breath. Negative for cough and hemoptysis.   Cardiovascular: Negative.  Negative for chest pain and leg swelling.  Gastrointestinal: Negative.  Negative for abdominal pain.  Genitourinary: Negative.  Negative for dysuria.  Musculoskeletal: Negative.  Negative for back pain.  Skin: Negative.  Negative for rash.  Neurological: Negative.  Negative for dizziness, focal weakness, weakness and headaches.  Psychiatric/Behavioral: Negative.  The patient is not nervous/anxious.     As per HPI. Otherwise, a complete review of systems is negative.  PAST MEDICAL HISTORY: Past Medical History:  Diagnosis Date  . Anxiety   . Chicken pox   . COPD (chronic obstructive pulmonary disease) ([place])   . Essential tremor    . Hypertension   . Hypomagnesemia   . 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 ABDOMINAL HYSTERECTOMY    . APPENDECTOMY    . BREAST BIOPSY Right 10/05/2020   stereo bx, x-clip, path pending   . CHOLECYSTECTOMY    . OVARIAN CYST REMOVAL    . SMALL INTESTINE SURGERY      FAMILY HISTORY: Family History  Problem Relation Age of Onset  . Heart attack Mother   . Heart disease Mother   . Hypertension Mother   . Cancer Father        bladder smoker  . Heart disease Brother        MI in 34s   . Breast cancer Neg Hx     ADVANCED DIRECTIVES (Y/N):  N  HEALTH MAINTENANCE: Social History   Tobacco Use  . Smoking status: Former Research scientist (life sciences)  . Smokeless tobacco: Never Used  Substance Use Topics  . Alcohol use: No  . Drug use: No     Colonoscopy:  PAP:  Bone density:  Lipid panel:  Allergies  Allergen Reactions  . Trelegy Ellipta [Fluticasone-Umeclidin-Vilant]     Shortness of breath  . Codeine Nausea And Vomiting  . Lipitor [Atorvastatin] Nausea And Vomiting  . Omeprazole     unknown  . Vicodin [Hydrocodone-Acetaminophen]     unknown  . Zyban [Bupropion]     unknown  . Mevacor [Lovastatin] Itching and Rash    Current Outpatient Medications  Medication Sig Dispense Refill  . acetaminophen (TYLENOL) 500 MG tablet Take  500 mg by mouth every 6 (six) hours as needed.    Marland Kitchen albuterol (VENTOLIN HFA) 108 (90 Base) MCG/ACT inhaler Inhale 1-2 puffs into the lungs every 6 (six) hours as needed for wheezing or shortness of breath.    Marland Kitchen amLODipine (NORVASC) 5 MG tablet Take 1 tablet (5 mg total) by mouth daily. 90 tablet 3  . budesonide-formoterol (SYMBICORT) 160-4.5 MCG/ACT inhaler Inhale 2 puffs into the lungs 2 (two) times daily.    . calcium carbonate (OSCAL) 1500 (600 Ca) MG TABS tablet Take 1,500 mg by mouth 2 (two)  times daily with a meal.     . Cholecalciferol (VITAMIN D-1000 MAX ST) 1000 units tablet Take 1,000 Units by mouth daily.     Marland Kitchen ezetimibe (ZETIA) 10 MG tablet Take 1 tablet (10 mg total) by mouth daily. 30 tablet 2  . fluticasone (FLONASE) 50 MCG/ACT nasal spray Place into the nose daily as needed.     . hydrocortisone 2.5 % ointment Apply topically 2 (two) times daily. As needed to skin 60 g 2  . hydrOXYzine (ATARAX/VISTARIL) 25 MG tablet Take 1 tablet (25 mg total) by mouth 2 (two) times daily as needed. Try at night first. Please deliver. Stop allegra 60 tablet 2  . levalbuterol (XOPENEX) 0.63 MG/3ML nebulizer solution Take 0.63 mg by nebulization every 4 (four) hours as needed for wheezing or shortness of breath.    . lisinopril-hydrochlorothiazide (ZESTORETIC) 10-12.5 MG tablet Take 1 tablet by mouth daily.     Marland Kitchen LORazepam (ATIVAN) 1 MG tablet Take 1 mg by mouth 2 (two) times daily.     . Magnesium 400 MG TABS Take by mouth daily.    . montelukast (SINGULAIR) 10 MG tablet TAKE 1 TABLET BY MOUTH AT BEDTIME 30 tablet 1  . Omega-3 Fatty Acids (FISH OIL) 1000 MG CAPS Take 1 capsule by mouth daily.    . ondansetron (ZOFRAN) 4 MG tablet Take 1 tablet (4 mg total) by mouth every 8 (eight) hours as needed for nausea or vomiting. 90 tablet 1  . potassium chloride SA (K-DUR) 20 MEQ tablet Take 1 tablet (20 mEq total) by mouth daily. 90 tablet 3  . cholestyramine (QUESTRAN) 4 g packet Take 1 packet (4 g total) by mouth daily before breakfast for 1 dose. As needed 60 each 5   No current facility-administered medications for this visit.    OBJECTIVE: Vitals:   10/09/20 1345  BP: 112/65  Pulse: 67  Temp: 98.2 F (36.8 C)  SpO2: 97%     Body mass index is 22.96 kg/m.    ECOG FS:1 - Symptomatic but completely ambulatory  General: Well-developed, well-nourished, no acute distress.  Sitting in a wheelchair. Eyes: Pink conjunctiva, anicteric sclera. HEENT: Normocephalic, moist mucous  membranes. Lungs: No audible wheezing or coughing. Heart: Regular rate and rhythm. Abdomen: Soft, nontender, no obvious distention. Musculoskeletal: No edema, cyanosis, or clubbing. Neuro: Alert, answering all questions appropriately. Cranial nerves grossly intact. Skin: No rashes or petechiae noted. Psych: Normal affect. Lymphatics: No cervical, calvicular, axillary or inguinal LAD.   LAB RESULTS:  Lab Results  Component Value Date   NA 141 09/28/2020   K 3.7 09/28/2020   CL 99 09/28/2020   CO2 32 09/28/2020   GLUCOSE 111 (H) 09/28/2020   BUN 25 (H) 09/28/2020   CREATININE 0.91 09/28/2020   CALCIUM 9.3 09/28/2020   PROT 6.4 09/28/2020   ALBUMIN 4.2 09/28/2020   AST 15 09/28/2020   ALT 8 09/28/2020   ALKPHOS  53 09/28/2020   BILITOT 1.2 09/28/2020   GFRNONAA >60 05/05/2019   GFRAA >60 05/05/2019    Lab Results  Component Value Date   WBC 5.6 09/28/2020   NEUTROABS 3.1 09/28/2020   HGB 13.2 09/28/2020   HCT 39.8 09/28/2020   MCV 91.4 09/28/2020   PLT 269.0 09/28/2020     STUDIES: MM Digital Diagnostic Unilat R  Result Date: 09/21/2020 CLINICAL DATA:  The patient was called back for right breast calcifications. EXAM: DIGITAL DIAGNOSTIC RIGHT MAMMOGRAM COMPARISON:  Previous exam(s). ACR Breast Density Category b: There are scattered areas of fibroglandular density. FINDINGS: There is a new group of calcifications in the lateral central right breast at a posterior depth spanning 1.4 cm which are indeterminate. IMPRESSION: Indeterminate new right breast calcifications. RECOMMENDATION: Recommend stereotactic biopsy of the new right breast calcifications. I have discussed the findings and recommendations with the patient. If applicable, a reminder letter will be sent to the patient regarding the next appointment. BI-RADS CATEGORY  4: Suspicious. Electronically Signed   By: Dorise Bullion III M.D   On: 09/21/2020 14:26   MM CLIP PLACEMENT RIGHT  Result Date:  10/05/2020 ADDENDUM REPORT: 10/05/2020 2:15 PM ADDENDUM: PLEASE NOTE: A COIL biopsy marker was placed (instead of the reported X clip). The COIL biopsy marker clip is in satisfactory position at the site of biopsy in the OUTER RIGHT breast. Residual calcifications extend 1.5 cm anterior to the COIL clip. Electronically Signed   By: Margarette Canada M.D.   On: 10/05/2020 14:15   MM RT BREAST BX W LOC DEV 1ST LESION IMAGE BX SPEC STEREO GUIDE  Addendum Date: 10/06/2020   ADDENDUM REPORT: 10/06/2020 12:47 ADDENDUM: PATHOLOGY revealed: A. BREAST, RIGHT, OUTER; STEREOTACTIC CORE NEEDLE BIOPSY: - DUCTAL CARCINOMA IN SITU, HIGH-GRADE WITH COMEDONECROSIS, WITH ASSOCIATED CALCIFICATIONS. - NEGATIVE FOR INVASIVE MAMMARY CARCINOMA. Comment: DCIS is present in 2 of 3 tissue blocks, and spans a distance of at least 2 mm in greatest extent. Pathology results are CONCORDANT with imaging findings, per Dr. Hassan Rowan. Pathology results and recommendations below were discussed with patient by telephone on 10/06/2020. Patient reported biopsy site within normal limits with slight tenderness at the site. Post biopsy care instructions were reviewed, questions were answered and my direct phone number was provided to patient. Patient was instructed to call [facility] if any concerns or questions arise related to the biopsy. Recommend 1. Surgical consultation: Request for surgical consultation relayed to Al Pimple RN and Tanya Nones RN at [facility] by Electa Sniff RN on 10/06/2020. 2.  Consider bilateral breast MRI due to high grade DCIS. Pathology results reported by Electa Sniff RN on 10/06/2020. Electronically Signed   By: Margarette Canada M.D.   On: 10/06/2020 12:47   Result Date: 10/06/2020 CLINICAL DATA:  86 year old female for tissue sampling of OUTER RIGHT breast calcifications. EXAM: RIGHT BREAST STEREOTACTIC CORE NEEDLE BIOPSY COMPARISON:  Previous exams. FINDINGS: The patient and I discussed the  procedure of stereotactic-guided biopsy including benefits and alternatives. We discussed the high likelihood of a successful procedure. We discussed the risks of the procedure including infection, bleeding, tissue injury, clip migration, and inadequate sampling. Informed written consent was given. The usual time out protocol was performed immediately prior to the procedure. Using sterile technique and 1% Lidocaine as local anesthetic, under stereotactic guidance, a 9 gauge vacuum assisted device was used to perform core needle biopsy of calcifications within the OUTER RIGHT breast using a LATERAL approach. Specimen radiograph was  performed showing calcifications. Specimens with calcifications are identified for pathology. At the conclusion of the procedure, a COIL tissue marker clip was deployed into the biopsy cavity. Follow-up 2-view mammogram was performed and dictated separately. IMPRESSION: Stereotactic-guided biopsy of OUTER RIGHT breast calcifications. No apparent complications. Electronically Signed: By: Margarette Canada M.D. On: 10/05/2020 14:13    ASSESSMENT: DCIS, right breast.  PLAN:    1. DCIS, right breast: Pathology and imaging results reviewed independently.  Given the noninvasive nature, patient will not require chemotherapy, but will require surgery as her initial treatment and has an appointment tomorrow to discuss lumpectomy.  We also briefly discussed the possibility of requiring adjuvant XRT following her surgery.  Finally, patient will benefit from tamoxifen for a total of 5 years after the completion of all of her treatments.  Return to clinic 1 to 2 weeks after her surgery to discuss her final pathology results and additional treatment planning.  I spent a total of 60 minutes reviewing chart data, face-to-face evaluation with the patient, counseling and coordination of care as detailed above.   Patient expressed understanding and was in agreement with this plan. She also understands  that She can call clinic at any time with any questions, concerns, or complaints.   Cancer Staging Ductal carcinoma in situ (DCIS) of right breast Staging form: Breast, AJCC 8th Edition - Clinical stage from 10/10/2020: Stage 0 (cTis (DCIS), cN0, cM0) - Signed by Lloyd Huger, MD on 10/10/2020   Lloyd Huger, MD   10/10/2020 6:38 AM

## 2020-10-12 ENCOUNTER — Other Ambulatory Visit: Payer: Self-pay

## 2020-10-12 DIAGNOSIS — E876 Hypokalemia: Secondary | ICD-10-CM

## 2020-10-12 MED ORDER — POTASSIUM CHLORIDE CRYS ER 20 MEQ PO TBCR: 20.0000 meq | EXTENDED_RELEASE_TABLET | Freq: Every day | ORAL | 1 refills | Status: DC

## 2020-10-13 LAB — SURGICAL PATHOLOGY

## 2020-10-14 NOTE — Progress Notes (Signed)
[place]  Telephone:(336) [phone] Fax:(336) [phone]  ID: Ariyel Jeangilles Prazak OB: 1933/05/26  MR#: [id number]  YKD#:[id number]  Patient Care Team: McLean-Scocuzza, Nino Glow, MD as PCP - General (Internal Medicine) De Hollingshead, RPH-CPP (Pharmacist) Theodore Demark, RN as Oncology Nurse Navigator Grayland Ormond, Kathlene November, MD as Consulting Physician (Oncology)  CHIEF COMPLAINT: DCIS, right breast.  INTERVAL HISTORY: Patient returns to clinic today for further evaluation and initiation of tamoxifen.  She had consultation with surgery and given her significant comorbidities it was determined that lumpectomy was not possible.  She has chronic shortness of breath and requires oxygen, but otherwise feels well.  She has no neurologic complaints.  She denies any recent fevers or illnesses.  She has a good appetite and denies weight loss.  She has no chest pain, cough, or hemoptysis.  She denies any nausea, vomiting, constipation, or diarrhea.  She has no urinary complaints.  Patient offers no further specific complaints today.  REVIEW OF SYSTEMS:   Review of Systems  Constitutional: Negative.  Negative for fever, malaise/fatigue and weight loss.  Respiratory: Positive for shortness of breath. Negative for cough and hemoptysis.   Cardiovascular: Negative.  Negative for chest pain and leg swelling.  Gastrointestinal: Negative.  Negative for abdominal pain.  Genitourinary: Negative.  Negative for dysuria.  Musculoskeletal: Negative.  Negative for back pain.  Skin: Negative.  Negative for rash.  Neurological: Negative.  Negative for dizziness, focal weakness, weakness and headaches.  Psychiatric/Behavioral: Negative.  The patient is not nervous/anxious.     As per HPI. Otherwise, a complete review of systems is negative.  PAST MEDICAL HISTORY: Past Medical History:  Diagnosis Date  . Anxiety   . Chicken pox   . COPD (chronic obstructive pulmonary disease) ([place])   .  Essential tremor   . Hypertension   . Hypomagnesemia   . Irritable bowel syndrome (IBS)   . 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 ABDOMINAL HYSTERECTOMY    . APPENDECTOMY    . BREAST BIOPSY Right 10/05/2020   stereo bx, x-clip, path pending   . CHOLECYSTECTOMY    . OVARIAN CYST REMOVAL    . SMALL INTESTINE SURGERY      FAMILY HISTORY: Family History  Problem Relation Age of Onset  . Heart attack Mother   . Heart disease Mother   . Hypertension Mother   . Cancer Father        bladder smoker  . Heart disease Brother        MI in 90s   . Breast cancer Neg Hx     ADVANCED DIRECTIVES (Y/N):  N  HEALTH MAINTENANCE: Social History   Tobacco Use  . Smoking status: Former Research scientist (life sciences)  . Smokeless tobacco: Never Used  Substance Use Topics  . Alcohol use: No  . Drug use: No     Colonoscopy:  PAP:  Bone density:  Lipid panel:  Allergies  Allergen Reactions  . Trelegy Ellipta [Fluticasone-Umeclidin-Vilant]     Shortness of breath  . Codeine Nausea And Vomiting  . Lipitor [Atorvastatin] Nausea And Vomiting  . Omeprazole     unknown  . Vicodin [Hydrocodone-Acetaminophen]     unknown  . Zyban [Bupropion]     unknown  . Mevacor [Lovastatin] Itching and Rash    Current Outpatient Medications  Medication Sig Dispense  Refill  . acetaminophen (TYLENOL) 500 MG tablet Take 500 mg by mouth every 6 (six) hours as needed.    Marland Kitchen albuterol (VENTOLIN HFA) 108 (90 Base) MCG/ACT inhaler Inhale 1-2 puffs into the lungs every 6 (six) hours as needed for wheezing or shortness of breath.    Marland Kitchen amLODipine (NORVASC) 5 MG tablet Take 1 tablet (5 mg total) by mouth daily. 90 tablet 3  . calcium carbonate (OSCAL) 1500 (600 Ca) MG TABS tablet Take 1,500 mg by mouth 2 (two) times daily with a meal.     . Cholecalciferol (VITAMIN D-1000  MAX ST) 1000 units tablet Take 1,000 Units by mouth daily.     Marland Kitchen ezetimibe (ZETIA) 10 MG tablet Take 1 tablet (10 mg total) by mouth daily. 30 tablet 2  . fluticasone (FLONASE) 50 MCG/ACT nasal spray Place into the nose daily as needed.     . hydrocortisone 2.5 % ointment Apply topically 2 (two) times daily. As needed to skin 60 g 2  . hydrOXYzine (ATARAX/VISTARIL) 25 MG tablet Take 1 tablet (25 mg total) by mouth 2 (two) times daily as needed. Try at night first. Please deliver. Stop allegra 60 tablet 2  . levalbuterol (XOPENEX) 0.63 MG/3ML nebulizer solution Take 0.63 mg by nebulization every 4 (four) hours as needed for wheezing or shortness of breath.    . lisinopril-hydrochlorothiazide (ZESTORETIC) 10-12.5 MG tablet Take 1 tablet by mouth daily.     Marland Kitchen LORazepam (ATIVAN) 1 MG tablet Take 1 mg by mouth 2 (two) times daily.     . Magnesium 400 MG TABS Take by mouth daily.    . montelukast (SINGULAIR) 10 MG tablet TAKE 1 TABLET BY MOUTH AT BEDTIME 30 tablet 1  . Omega-3 Fatty Acids (FISH OIL) 1000 MG CAPS Take 1 capsule by mouth daily.    . ondansetron (ZOFRAN) 4 MG tablet Take 1 tablet (4 mg total) by mouth every 8 (eight) hours as needed for nausea or vomiting. 90 tablet 1  . potassium chloride SA (KLOR-CON) 20 MEQ tablet Take 1 tablet (20 mEq total) by mouth daily. 90 tablet 1  . cholestyramine (QUESTRAN) 4 g packet Take 1 packet (4 g total) by mouth daily before breakfast for 1 dose. As needed (Patient not taking: Reported on 10/19/2020) 60 each 5  . tamoxifen (NOLVADEX) 20 MG tablet Take 1 tablet (20 mg total) by mouth daily. 90 tablet 3   No current facility-administered medications for this visit.    OBJECTIVE: Vitals:   10/20/20 1305  BP: 134/70  Pulse: (!) 113  Temp: 97.7 F (36.5 C)  SpO2: 97%     Body mass index is 24 kg/m.    ECOG FS:1 - Symptomatic but completely ambulatory  General: Well-developed, well-nourished, no acute distress.  Sitting in a wheelchair. Eyes: Pink  conjunctiva, anicteric sclera. HEENT: Normocephalic, moist mucous membranes. Lungs: No audible wheezing or coughing. Heart: Regular rate and rhythm. Abdomen: Soft, nontender, no obvious distention. Musculoskeletal: No edema, cyanosis, or clubbing. Neuro: Alert, answering all questions appropriately. Cranial nerves grossly intact. Skin: No rashes or petechiae noted. Psych: Normal affect.   LAB RESULTS:  Lab Results  Component Value Date   NA 141 09/28/2020   K 3.7 09/28/2020   CL 99 09/28/2020   CO2 32 09/28/2020   GLUCOSE 111 (H) 09/28/2020   BUN 25 (H) 09/28/2020   CREATININE 0.91 09/28/2020   CALCIUM 9.3 09/28/2020   PROT 6.4 09/28/2020   ALBUMIN 4.2 09/28/2020   AST 15 09/28/2020  ALT 8 09/28/2020   ALKPHOS 53 09/28/2020   BILITOT 1.2 09/28/2020   GFRNONAA >60 05/05/2019   GFRAA >60 05/05/2019    Lab Results  Component Value Date   WBC 5.6 09/28/2020   NEUTROABS 3.1 09/28/2020   HGB 13.2 09/28/2020   HCT 39.8 09/28/2020   MCV 91.4 09/28/2020   PLT 269.0 09/28/2020     STUDIES: MM Digital Diagnostic Unilat R  Result Date: 09/21/2020 CLINICAL DATA:  The patient was called back for right breast calcifications. EXAM: DIGITAL DIAGNOSTIC RIGHT MAMMOGRAM COMPARISON:  Previous exam(s). ACR Breast Density Category b: There are scattered areas of fibroglandular density. FINDINGS: There is a new group of calcifications in the lateral central right breast at a posterior depth spanning 1.4 cm which are indeterminate. IMPRESSION: Indeterminate new right breast calcifications. RECOMMENDATION: Recommend stereotactic biopsy of the new right breast calcifications. I have discussed the findings and recommendations with the patient. If applicable, a reminder letter will be sent to the patient regarding the next appointment. BI-RADS CATEGORY  4: Suspicious. Electronically Signed   By: Dorise Bullion III M.D   On: 09/21/2020 14:26   MM CLIP PLACEMENT RIGHT  Result Date:  10/05/2020 ADDENDUM REPORT: 10/05/2020 2:15 PM ADDENDUM: PLEASE NOTE: A COIL biopsy marker was placed (instead of the reported X clip). The COIL biopsy marker clip is in satisfactory position at the site of biopsy in the OUTER RIGHT breast. Residual calcifications extend 1.5 cm anterior to the COIL clip. Electronically Signed   By: Margarette Canada M.D.   On: 10/05/2020 14:15   MM RT BREAST BX W LOC DEV 1ST LESION IMAGE BX SPEC STEREO GUIDE  Addendum Date: 10/06/2020   ADDENDUM REPORT: 10/06/2020 12:47 ADDENDUM: PATHOLOGY revealed: A. BREAST, RIGHT, OUTER; STEREOTACTIC CORE NEEDLE BIOPSY: - DUCTAL CARCINOMA IN SITU, HIGH-GRADE WITH COMEDONECROSIS, WITH ASSOCIATED CALCIFICATIONS. - NEGATIVE FOR INVASIVE MAMMARY CARCINOMA. Comment: DCIS is present in 2 of 3 tissue blocks, and spans a distance of at least 2 mm in greatest extent. Pathology results are CONCORDANT with imaging findings, per Dr. Hassan Rowan. Pathology results and recommendations below were discussed with patient by telephone on 10/06/2020. Patient reported biopsy site within normal limits with slight tenderness at the site. Post biopsy care instructions were reviewed, questions were answered and my direct phone number was provided to patient. Patient was instructed to call [facility] if any concerns or questions arise related to the biopsy. Recommend 1. Surgical consultation: Request for surgical consultation relayed to Al Pimple RN and Tanya Nones RN at [facility] by Electa Sniff RN on 10/06/2020. 2.  Consider bilateral breast MRI due to high grade DCIS. Pathology results reported by Electa Sniff RN on 10/06/2020. Electronically Signed   By: Margarette Canada M.D.   On: 10/06/2020 12:47   Result Date: 10/06/2020 CLINICAL DATA:  86 year old female for tissue sampling of OUTER RIGHT breast calcifications. EXAM: RIGHT BREAST STEREOTACTIC CORE NEEDLE BIOPSY COMPARISON:  Previous exams. FINDINGS: The patient and I discussed the  procedure of stereotactic-guided biopsy including benefits and alternatives. We discussed the high likelihood of a successful procedure. We discussed the risks of the procedure including infection, bleeding, tissue injury, clip migration, and inadequate sampling. Informed written consent was given. The usual time out protocol was performed immediately prior to the procedure. Using sterile technique and 1% Lidocaine as local anesthetic, under stereotactic guidance, a 9 gauge vacuum assisted device was used to perform core needle biopsy of calcifications within the OUTER RIGHT breast using  a LATERAL approach. Specimen radiograph was performed showing calcifications. Specimens with calcifications are identified for pathology. At the conclusion of the procedure, a COIL tissue marker clip was deployed into the biopsy cavity. Follow-up 2-view mammogram was performed and dictated separately. IMPRESSION: Stereotactic-guided biopsy of OUTER RIGHT breast calcifications. No apparent complications. Electronically Signed: By: Margarette Canada M.D. On: 10/05/2020 14:13    ASSESSMENT: DCIS, right breast.  PLAN:    1. DCIS, right breast: Pathology and imaging results reviewed independently.  Consultation with surgery determined that back to me would be too difficult given her comorbidities.  Given the noninvasive nature, patient will not require chemotherapy.  Patient was initiated on tamoxifen today which she will require to take for a minimum of 5 years completing treatment in November 2026.  Because she did not undergo surgery or XRT, will repeat mammogram in approximately 6 months.  Return to clinic in 3 months for routine evaluation.    I spent a total of 30 minutes reviewing chart data, face-to-face evaluation with the patient, counseling and coordination of care as detailed above.   Patient expressed understanding and was in agreement with this plan. She also understands that She can call clinic at any time with any  questions, concerns, or complaints.   Cancer Staging Ductal carcinoma in situ (DCIS) of right breast with comedonecrosis Staging form: Breast, AJCC 8th Edition - Clinical stage from 10/10/2020: Stage 0 (cTis (DCIS), cN0, cM0, ER+) - Signed by Lloyd Huger, MD on 10/14/2020   Lloyd Huger, MD   10/21/2020 11:03 AM

## 2020-10-19 ENCOUNTER — Encounter: Payer: Self-pay | Admitting: Oncology

## 2020-10-19 NOTE — Progress Notes (Signed)
Patient denies any pain or concerns at this time.  

## 2020-10-20 ENCOUNTER — Ambulatory Visit: Payer: Medicare Other | Admitting: Oncology

## 2020-10-20 ENCOUNTER — Inpatient Hospital Stay: Payer: Medicare Other | Attending: Oncology | Admitting: Oncology

## 2020-10-20 ENCOUNTER — Other Ambulatory Visit: Payer: Self-pay

## 2020-10-20 VITALS — BP 134/70 | HR 113 | Temp 97.7°F | Wt 135.5 lb

## 2020-10-20 DIAGNOSIS — Z79810 Long term (current) use of selective estrogen receptor modulators (SERMs): Secondary | ICD-10-CM | POA: Diagnosis not present

## 2020-10-20 DIAGNOSIS — J449 Chronic obstructive pulmonary disease, unspecified: Secondary | ICD-10-CM | POA: Diagnosis not present

## 2020-10-20 DIAGNOSIS — Z79899 Other long term (current) drug therapy: Secondary | ICD-10-CM | POA: Insufficient documentation

## 2020-10-20 DIAGNOSIS — Z170 Estrogen receptor positive status [ER+]: Secondary | ICD-10-CM | POA: Insufficient documentation

## 2020-10-20 DIAGNOSIS — I10 Essential (primary) hypertension: Secondary | ICD-10-CM | POA: Insufficient documentation

## 2020-10-20 DIAGNOSIS — D0511 Intraductal carcinoma in situ of right breast: Secondary | ICD-10-CM | POA: Diagnosis not present

## 2020-10-20 DIAGNOSIS — Z85828 Personal history of other malignant neoplasm of skin: Secondary | ICD-10-CM | POA: Insufficient documentation

## 2020-10-20 DIAGNOSIS — Z9981 Dependence on supplemental oxygen: Secondary | ICD-10-CM | POA: Diagnosis not present

## 2020-10-20 MED ORDER — TAMOXIFEN CITRATE 20 MG PO TABS: 20.0000 mg | ORAL_TABLET | Freq: Every day | ORAL | 3 refills | Status: AC

## 2020-10-24 DIAGNOSIS — J449 Chronic obstructive pulmonary disease, unspecified: Secondary | ICD-10-CM | POA: Diagnosis not present

## 2020-10-26 ENCOUNTER — Encounter: Payer: Self-pay | Admitting: Internal Medicine

## 2020-10-26 ENCOUNTER — Other Ambulatory Visit: Payer: Self-pay

## 2020-10-26 ENCOUNTER — Ambulatory Visit (INDEPENDENT_AMBULATORY_CARE_PROVIDER_SITE_OTHER): Payer: Medicare Other | Admitting: Internal Medicine

## 2020-10-26 VITALS — BP 116/66 | HR 91 | Temp 98.1°F | Ht 63.0 in | Wt 133.4 lb

## 2020-10-26 DIAGNOSIS — G4700 Insomnia, unspecified: Secondary | ICD-10-CM | POA: Diagnosis not present

## 2020-10-26 DIAGNOSIS — M810 Age-related osteoporosis without current pathological fracture: Secondary | ICD-10-CM | POA: Diagnosis not present

## 2020-10-26 DIAGNOSIS — E785 Hyperlipidemia, unspecified: Secondary | ICD-10-CM | POA: Diagnosis not present

## 2020-10-26 DIAGNOSIS — Z13818 Encounter for screening for other digestive system disorders: Secondary | ICD-10-CM

## 2020-10-26 DIAGNOSIS — D0511 Intraductal carcinoma in situ of right breast: Secondary | ICD-10-CM | POA: Diagnosis not present

## 2020-10-26 DIAGNOSIS — Z853 Personal history of malignant neoplasm of breast: Secondary | ICD-10-CM | POA: Diagnosis not present

## 2020-10-26 DIAGNOSIS — Z1389 Encounter for screening for other disorder: Secondary | ICD-10-CM

## 2020-10-26 MED ORDER — DENOSUMAB 60 MG/ML ~~LOC~~ SOSY
60.0000 mg | PREFILLED_SYRINGE | Freq: Once | SUBCUTANEOUS | Status: AC
  Administered 2020-10-26: 60 mg via SUBCUTANEOUS

## 2020-10-26 NOTE — Patient Instructions (Addendum)
amlactin cream  sarna for itching  cerave cream   Consider moderna booster 11.14.21    Come back for prevnar end 11/2020, 12/2020   Denosumab injection What is this medicine? DENOSUMAB (den oh sue mab) slows bone breakdown. Prolia is used to treat osteoporosis in women after menopause and in men, and in people who are taking corticosteroids for 6 months or more. Delton See is used to treat a high calcium level due to cancer and to prevent bone fractures and other bone problems caused by multiple myeloma or cancer bone metastases. Delton See is also used to treat giant cell tumor of the bone. This medicine may be used for other purposes; ask your health care provider or pharmacist if you have questions. COMMON BRAND NAME(S): Prolia, XGEVA What should I tell my health care provider before I take this medicine? They need to know if you have any of these conditions:  dental disease  having surgery or tooth extraction  infection  kidney disease  low levels of calcium or Vitamin D in the blood  malnutrition  on hemodialysis  skin conditions or sensitivity  thyroid or parathyroid disease  an unusual reaction to denosumab, other medicines, foods, dyes, or preservatives  pregnant or trying to get pregnant  breast-feeding How should I use this medicine? This medicine is for injection under the skin. It is given by a health care professional in a hospital or clinic setting. A special MedGuide will be given to you before each treatment. Be sure to read this information carefully each time. For Prolia, talk to your pediatrician regarding the use of this medicine in children. Special care may be needed. For Delton See, talk to your pediatrician regarding the use of this medicine in children. While this drug may be prescribed for children as young as 13 years for selected conditions, precautions do apply. Overdosage: If you think you have taken too much of this medicine contact a poison control center or  emergency room at once. NOTE: This medicine is only for you. Do not share this medicine with others. What if I miss a dose? It is important not to miss your dose. Call your doctor or health care professional if you are unable to keep an appointment. What may interact with this medicine? Do not take this medicine with any of the following medications:  other medicines containing denosumab This medicine may also interact with the following medications:  medicines that lower your chance of fighting infection  steroid medicines like prednisone or cortisone This list may not describe all possible interactions. Give your health care provider a list of all the medicines, herbs, non-prescription drugs, or dietary supplements you use. Also tell them if you smoke, drink alcohol, or use illegal drugs. Some items may interact with your medicine. What should I watch for while using this medicine? Visit your doctor or health care professional for regular checks on your progress. Your doctor or health care professional may order blood tests and other tests to see how you are doing. Call your doctor or health care professional for advice if you get a fever, chills or sore throat, or other symptoms of a cold or flu. Do not treat yourself. This drug may decrease your body's ability to fight infection. Try to avoid being around people who are sick. You should make sure you get enough calcium and vitamin D while you are taking this medicine, unless your doctor tells you not to. Discuss the foods you eat and the vitamins you take with  your health care professional. See your dentist regularly. Brush and floss your teeth as directed. Before you have any dental work done, tell your dentist you are receiving this medicine. Do not become pregnant while taking this medicine or for 5 months after stopping it. Talk with your doctor or health care professional about your birth control options while taking this medicine. Women  should inform their doctor if they wish to become pregnant or think they might be pregnant. There is a potential for serious side effects to an unborn child. Talk to your health care professional or pharmacist for more information. What side effects may I notice from receiving this medicine? Side effects that you should report to your doctor or health care professional as soon as possible:  allergic reactions like skin rash, itching or hives, swelling of the face, lips, or tongue  bone pain  breathing problems  dizziness  jaw pain, especially after dental work  redness, blistering, peeling of the skin  signs and symptoms of infection like fever or chills; cough; sore throat; pain or trouble passing urine  signs of low calcium like fast heartbeat, muscle cramps or muscle pain; pain, tingling, numbness in the hands or feet; seizures  unusual bleeding or bruising  unusually weak or tired Side effects that usually do not require medical attention (report to your doctor or health care professional if they continue or are bothersome):  constipation  diarrhea  headache  joint pain  loss of appetite  muscle pain  runny nose  tiredness  upset stomach This list may not describe all possible side effects. Call your doctor for medical advice about side effects. You may report side effects to FDA at 1-800-FDA-1088. Where should I keep my medicine? This medicine is only given in a clinic, doctor's office, or other health care setting and will not be stored at home. NOTE: This sheet is a summary. It may not cover all possible information. If you have questions about this medicine, talk to your doctor, pharmacist, or health care provider.  2020 Elsevier/Gold Standard (2018-04-10 16:10:44)

## 2020-10-26 NOTE — Progress Notes (Signed)
Chief Complaint  Patient presents with  . Follow-up  . Immunizations    prolia   F/u with daughter  1. Breast cancer new dx sees Dr. Grayland Ormond 10/05/20 bx right breast per h/o not surgical candidate no need MRI breasts and not candidate for radiation on tamoxifen x 5 years at least  Offered pt surgical consult Dr. Barry Dienes and she declines for now   DUCTAL CARCINOMA IN SITU, HIGH-GRADE WITH COMEDONECROSIS, WITH  ASSOCIATED CALCIFICATIONS.  - NEGATIVE FOR INVASIVE MAMMARY CARCINOMA. 2. Osteoporosis polia 1st shot today  3. Insomnia she reports sleeping with the tv on today    Review of Systems  Constitutional: Negative for weight loss.  HENT: Positive for hearing loss.   Eyes: Negative for blurred vision.  Respiratory:       +chronic sob on cont. o2    Cardiovascular: Negative for chest pain.  Musculoskeletal: Negative for falls and joint pain.  Skin: Negative for rash.  Psychiatric/Behavioral: The patient has insomnia.    Past Medical History:  Diagnosis Date  . Anxiety   . Chicken pox   . COPD (chronic obstructive pulmonary disease) ([place])   . Essential tremor   . Hypertension   . Hypomagnesemia   . Irritable bowel syndrome (IBS)   . 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 ABDOMINAL HYSTERECTOMY    . APPENDECTOMY    . BREAST BIOPSY Right 10/05/2020   stereo bx, x-clip, path pending   . CHOLECYSTECTOMY    . OVARIAN CYST REMOVAL    . SMALL INTESTINE SURGERY     Family History  Problem Relation Age of Onset  . Heart attack Mother   . Heart disease Mother   . Hypertension Mother   . Cancer Father        bladder smoker  . Heart disease Brother        MI in 72s   . Breast cancer Neg Hx    Social History   Socioeconomic History  . Marital status: Widowed    Spouse name: Not on file  . Number of children: Not on file  . Years of  education: Not on file  . Highest education level: Not on file  Occupational History  . Not on file  Tobacco Use  . Smoking status: Former Research scientist (life sciences)  . Smokeless tobacco: Never Used  Substance and Sexual Activity  . Alcohol use: No  . Drug use: No  . Sexual activity: Not Currently  Other Topics Concern  . Not on file  Social History Narrative   Has 4 children 3 girls and 1 boy   Former smoker    Lives in in [place] Strain: Medium Risk  . Difficulty of Paying Living Expenses: Somewhat hard  Food Insecurity:   . Worried About Charity fundraiser in the Last Year: Not on file  . Ran Out of Food in the Last Year: Not on file  Transportation Needs:   . Lack of Transportation (Medical): Not on file  . Lack of Transportation (Non-Medical): Not on file  Physical Activity:   . Days of Exercise per Week: Not on file  . Minutes of Exercise per Session: Not on file  Stress:   . Feeling of Stress : Not  on file  Social Connections:   . Frequency of Communication with Friends and Family: Not on file  . Frequency of Social Gatherings with Friends and Family: Not on file  . Attends Religious Services: Not on file  . Active Member of Clubs or Organizations: Not on file  . Attends Archivist Meetings: Not on file  . Marital Status: Not on file  Intimate Partner Violence:   . Fear of Current or Ex-Partner: Not on file  . Emotionally Abused: Not on file  . Physically Abused: Not on file  . Sexually Abused: Not on file   Current Meds  Medication Sig  . acetaminophen (TYLENOL) 500 MG tablet Take 500 mg by mouth every 6 (six) hours as needed.  Marland Kitchen albuterol (VENTOLIN HFA) 108 (90 Base) MCG/ACT inhaler Inhale 1-2 puffs into the lungs every 6 (six) hours as needed for wheezing or shortness of breath.  Marland Kitchen amLODipine (NORVASC) 5 MG tablet Take 1 tablet (5 mg total) by mouth daily.  . calcium  carbonate (OSCAL) 1500 (600 Ca) MG TABS tablet Take 1,500 mg by mouth 2 (two) times daily with a meal.   . Cholecalciferol (VITAMIN D-1000 MAX ST) 1000 units tablet Take 1,000 Units by mouth daily.   Marland Kitchen denosumab (PROLIA) 60 MG/ML SOSY injection Inject 60 mg into the skin every 6 (six) months.  . ezetimibe (ZETIA) 10 MG tablet Take 1 tablet (10 mg total) by mouth daily.  . fluticasone (FLONASE) 50 MCG/ACT nasal spray Place into the nose daily as needed.   . hydrocortisone 2.5 % ointment Apply topically 2 (two) times daily. As needed to skin  . hydrOXYzine (ATARAX/VISTARIL) 25 MG tablet Take 1 tablet (25 mg total) by mouth 2 (two) times daily as needed. Try at night first. Please deliver. Stop allegra  . levalbuterol (XOPENEX) 0.63 MG/3ML nebulizer solution Take 0.63 mg by nebulization every 4 (four) hours as needed for wheezing or shortness of breath.  Marland Kitchen LORazepam (ATIVAN) 1 MG tablet Take 1 mg by mouth 2 (two) times daily.   . Magnesium 400 MG TABS Take by mouth daily.  . Omega-3 Fatty Acids (FISH OIL) 1000 MG CAPS Take 1 capsule by mouth daily.  . ondansetron (ZOFRAN) 4 MG tablet Take 1 tablet (4 mg total) by mouth every 8 (eight) hours as needed for nausea or vomiting.  . potassium chloride SA (KLOR-CON) 20 MEQ tablet Take 1 tablet (20 mEq total) by mouth daily.  . tamoxifen (NOLVADEX) 20 MG tablet Take 1 tablet (20 mg total) by mouth daily.  . [DISCONTINUED] montelukast (SINGULAIR) 10 MG tablet TAKE 1 TABLET BY MOUTH AT BEDTIME   Allergies  Allergen Reactions  . Trelegy Ellipta [Fluticasone-Umeclidin-Vilant]     Shortness of breath  . Codeine Nausea And Vomiting  . Lipitor [Atorvastatin] Nausea And Vomiting  . Omeprazole     unknown  . Vicodin [Hydrocodone-Acetaminophen]     unknown  . Zyban [Bupropion]     unknown  . Mevacor [Lovastatin] Itching and Rash   Recent Results (from the past 2160 hour(s))  CBC with Differential/Platelet     Status: None   Collection Time: 09/28/20  9:13  AM  Result Value Ref Range   WBC 5.6 4.0 - 10.5 K/uL   RBC 4.35 3.87 - 5.11 Mil/uL   Hemoglobin 13.2 12.0 - 15.0 g/dL   HCT 39.8 36 - 46 %   MCV 91.4 78.0 - 100.0 fl   MCHC 33.3 30.0 - 36.0 g/dL   RDW 13.4  11.5 - 15.5 %   Platelets 269.0 150 - 400 K/uL   Neutrophils Relative % 56.2 43 - 77 %   Lymphocytes Relative 34.3 12 - 46 %   Monocytes Relative 7.4 3 - 12 %   Eosinophils Relative 1.2 0 - 5 %   Basophils Relative 0.9 0 - 3 %   Neutro Abs 3.1 1.4 - 7.7 K/uL   Lymphs Abs 1.9 0.7 - 4.0 K/uL   Monocytes Absolute 0.4 0.1 - 1.0 K/uL   Eosinophils Absolute 0.1 0.0 - 0.7 K/uL   Basophils Absolute 0.0 0.0 - 0.1 K/uL  TSH     Status: None   Collection Time: 09/28/20  9:13 AM  Result Value Ref Range   TSH 2.11 0.35 - 4.50 uIU/mL  Lipid panel     Status: Abnormal   Collection Time: 09/28/20  9:13 AM  Result Value Ref Range   Cholesterol 245 (H) 0 - 200 mg/dL    Comment: ATP III Classification       Desirable:  < 200 mg/dL               Borderline High:  200 - 239 mg/dL          High:  > = 240 mg/dL   Triglycerides 114.0 0 - 149 mg/dL    Comment: Normal:  <150 mg/dLBorderline High:  150 - 199 mg/dL   HDL 103.20 >39.00 mg/dL   VLDL 22.8 0.0 - 40.0 mg/dL   LDL Cholesterol 119 (H) 0 - 99 mg/dL   Total CHOL/HDL Ratio 2     Comment:                Men          Women1/2 Average Risk     3.4          3.3Average Risk          5.0          4.42X Average Risk          9.6          7.13X Average Risk          15.0          11.0                       NonHDL 141.32     Comment: NOTE:  Non-HDL goal should be 30 mg/dL higher than patient's LDL goal (i.e. LDL goal of < 70 mg/dL, would have non-HDL goal of < 100 mg/dL)  Comprehensive metabolic panel     Status: Abnormal   Collection Time: 09/28/20  9:13 AM  Result Value Ref Range   Sodium 141 135 - 145 mEq/L   Potassium 3.7 3.5 - 5.1 mEq/L   Chloride 99 96 - 112 mEq/L   CO2 32 19 - 32 mEq/L   Glucose, Bld 111 (H) 70 - 99 mg/dL   BUN 25 (H) 6 - 23  mg/dL   Creatinine, Ser 0.91 0.40 - 1.20 mg/dL   Total Bilirubin 1.2 0.2 - 1.2 mg/dL   Alkaline Phosphatase 53 39 - 117 U/L   AST 15 0 - 37 U/L   ALT 8 0 - 35 U/L   Total Protein 6.4 6.0 - 8.3 g/dL   Albumin 4.2 3.5 - 5.2 g/dL   GFR 57.59 (L) >60.00 mL/min   Calcium 9.3 8.4 - 10.5 mg/dL  Surgical pathology     Status: None  Collection Time: 10/05/20  1:26 PM  Result Value Ref Range   SURGICAL PATHOLOGY      SURGICAL PATHOLOGY THIS IS AN ADDENDUM REPOR CASE: [id number] PATIENT: [facility]ology Report Addendum  Reason for Addendum #1:  Breast Biomarker Results  Specimen Submitted: A. Right breast outer; stereo bx  Clinical History: 86 yo f with 1.4 cm group of outer right breast calcifications. Coil-shaped clip placed following stereotactic biopsy of RIGHT breast, outer. Residual calcifications extend 1.5 cm anterior to clip.    DIAGNOSIS: A. BREAST, RIGHT, OUTER; STEREOTACTIC CORE NEEDLE BIOPSY: - DUCTAL CARCINOMA IN SITU, HIGH-GRADE WITH COMEDONECROSIS, WITH ASSOCIATED CALCIFICATIONS. - NEGATIVE FOR INVASIVE MAMMARY CARCINOMA.  Comment: DCIS is present in 2 of 3 tissue blocks, and spans a distance of at least 2 mm in greatest extent.  Ancillary estrogen receptor testing will be deferred to the final resection.  GROSS DESCRIPTION: A. Labeled: Right breast stereo biopsy calcifications outer Received: in a  formalin-filled Brevera collection device Specimen radiograph image(s) available for review Time/Date in fixative: 1:22 PM on 10/05/2020 Cold ischemic time: 2 minutes Total fixation time: 6-1/2 hours Core pieces: Multiple Measurement: 1.5 x 1.5 x 0.7 cm Description / comments: Aggregate of pale yellow cylindrically shaped fibrofatty tissue fragments in the A-D sections of the device with calcifications marked off in the B and D sections Inked: Black Entirely submitted in cassette(s): 1-section B 2-section D 3-section A, C  Final  Diagnosis performed by Allena Napoleon, MD.   Electronically signed 10/06/2020 10:13:44AM The electronic signature indicates that the named Attending Pathologist has evaluated the specimen Technical component performed at [facility], [address], [place], [place] [address] Lab: [phone] Dir: Rush Farmer, MD, MMM  Professional component performed at [facility], [facility], [place], [place], [place] [address] Lab:  [phone] Dir: Dellia Nims. Rubinas, MD  ADDENDUM:  CASE SUMMARY: BREAST BIOMARKER TESTS TEST(S) PERFORMED: Estrogen Receptor (ER) Status: POSITIVE         Percentage of cells with nuclear positivity: 51-90%         Average intensity of staining: Strong  Cold Ischemia and Fixation Times: Meet requirements specified in latest version of the ASCO/CAP guidelines Testing Performed on Block Number(s): A1  METHODS Fixative: Formalin Estrogen Receptor:  FDA cleared ([place]) Primary Antibody:  SP1  Immunohistochemistry controls worked appropriately. Slides were prepared by [facility], [place], [place], and interpreted by Allena Napoleon, MD.  The ASCO/CAP 2020 guidelines recommend testing for ER expression in cases of newly diagnosed DCIS. However, testing for PgR expression is considered optional. There is no data currently supporting the prognostic or predictive value of PgR testing in DCIS independent of ER. Testing for PgR will therefore  not be routinely performed on cases of newly diagnosed DCIS.  This test was developed and its performance characteristics determined by [facility]. It has not been cleared or approved by the [place] Food and Drug Administration. The FDA does not require this test to go through premarket FDA review. This test is used for clinical purposes. It should not be regarded as investigational or for research. This laboratory is certified under the Clinical Laboratory Improvement Amendments (CLIA)  as qualified to perform high complexity clinical laboratory testing.  Reference: [facility], [place] M, et al. Estrogen and Progesterone Receptor Testing in Breast Cancer: American Society of Clinical Oncology/College of American Pathologists Guideline Update. Arch Pathol Lab Med. [phone] May;144(5):545-563. doi: 10.5858/arpa.2019-0904-SA. Epub 2020 Jan 13. PMID: [id number].   Addendum #  1 performed by Allena Napoleon, MD.   Electronically signed 10/13/2020 1:05:19PM The electronic signa ture indicates that the named Attending Pathologist has evaluated the specimen Technical component performed at [facility], [address] [place], [place] [address] Lab: [phone] Dir: Rush Farmer, MD, MMM  Professional component performed at [facility], [facility], [place], [place], [place] [address] Lab: [phone] Dir: Dellia Nims. Rubinas, MD    Objective  Body mass index is 23.63 kg/m. Wt Readings from Last 3 Encounters:  10/26/20 133 lb 6.4 oz (60.5 kg)  10/20/20 135 lb 8 oz (61.5 kg)  10/10/20 135 lb 6.4 oz (61.4 kg)   Temp Readings from Last 3 Encounters:  10/26/20 98.1 F (36.7 C) (Oral)  10/20/20 97.7 F (36.5 C) (Tympanic)  10/10/20 97.9 F (36.6 C)   BP Readings from Last 3 Encounters:  10/26/20 116/66  10/20/20 134/70  10/10/20 (!) 160/75   Pulse Readings from Last 3 Encounters:  10/26/20 91  10/20/20 (!) 113  10/10/20 98    Physical Exam Vitals and nursing note reviewed.  Constitutional:      Appearance: Normal appearance. She is well-developed and well-groomed.  HENT:     Head: Normocephalic and atraumatic.  Eyes:     Conjunctiva/sclera: Conjunctivae normal.     Pupils: Pupils are equal, round, and reactive to light.  Cardiovascular:     Rate and Rhythm: Normal rate and regular rhythm.     Heart sounds: Normal heart sounds. No murmur heard.   Pulmonary:     Effort: Pulmonary effort is normal.     Breath sounds: Normal breath  sounds.  Skin:    General: Skin is warm and dry.  Neurological:     General: No focal deficit present.     Mental Status: She is alert and oriented to person, place, and time. Mental status is at baseline.     Gait: Gait normal.     Comments: Gait baseline  Psychiatric:        Attention and Perception: Attention and perception normal.        Mood and Affect: Mood and affect normal.        Speech: Speech normal.        Behavior: Behavior normal. Behavior is cooperative.        Thought Content: Thought content normal.        Cognition and Memory: Memory normal.        Judgment: Judgment normal.     Assessment  Plan  Ductal carcinoma in situ (DCIS) of right breast  Osteoporosis, unspecified osteoporosis type, unspecified pathological fracture presence - Plan: denosumab (PROLIA) injection 60 mg  History of right breast cancer  Hyperlipidemia, unspecified hyperlipidemia type  Insomnia, unspecified type   Flu  09/28/20  -prevnar late 11/2020 and 12/2020 and consider repeat pna 23 in 1 year  01/18/20 moderna sch 2/2consider 3rd dose 10/29/20   Tdap 02/09/16    Out of age window pap, colonoscopy mammo 09/21/20 abnormal bx 10/02/20 + right DCIS breast cancer with est Dr. Grayland Ormond consider Dr. Barry Dienes in future as of 10/30/20 declines Also disc MRI b/l breast h/o he states she is not candidate   DUCTAL CARCINOMA IN SITU, HIGH-GRADE WITH COMEDONECROSIS, WITH  ASSOCIATED CALCIFICATIONS.  - NEGATIVE FOR INVASIVE MAMMARY CARCINOMA. Consider DEXAif has not hadordered dexa can have with mammogram -call to schedule  Former smoker with COPD on cont O2   Osteoporosis with prolia shot today 10/26/20 next due 04/25/21   H/o Dr.  Finnegan  GI Dr. Tiffany Kocher  Lung Dr. Rosita Kea Dr. Michaelene Song AE  Provider: Dr. Olivia Mackie McLean-Scocuzza-Internal Medicine

## 2020-10-30 DIAGNOSIS — G4700 Insomnia, unspecified: Secondary | ICD-10-CM | POA: Insufficient documentation

## 2020-11-01 ENCOUNTER — Inpatient Hospital Stay (HOSPITAL_BASED_OUTPATIENT_CLINIC_OR_DEPARTMENT_OTHER): Payer: Medicare Other | Admitting: Hospice and Palliative Medicine

## 2020-11-01 DIAGNOSIS — D0511 Intraductal carcinoma in situ of right breast: Secondary | ICD-10-CM

## 2020-11-01 NOTE — Progress Notes (Signed)
Multidisciplinary Oncology Council Documentation  Idell Hissong Schreifels was presented by our [facility] on 11/01/2020, which included representatives from:  . [place] . Dietitian . Physical/Occupational Therapist . Speech Therapist . Survivorship Nurse . Nurse Navigator . 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 The [place] considered available treatment options and made the following recommendations/referrals: No orders of the defined types were placed in this encounter.   The MOC is a meeting of clinicians from various specialty areas who evaluate and discuss patients for whom a multidisciplinary approach is being considered. Final determinations in the plan of care are those of the provider(s).   Today's extended care, comprehensive team conference, Leean was not present for the discussion and was not examined.

## 2020-11-03 NOTE — Progress Notes (Signed)
Patient ID: Kristen Bailey, female   DOB: March 29, 1933, 86 y.o.   MRN: [id number] Reviewed and answered patient questions about Tamoxifen.

## 2020-11-06 ENCOUNTER — Telehealth: Payer: Self-pay

## 2020-11-06 NOTE — Telephone Encounter (Signed)
Noted  For your information   

## 2020-11-06 NOTE — Telephone Encounter (Signed)
Noted she declined to go will reassess in future

## 2020-11-06 NOTE — Telephone Encounter (Signed)
[facility] called to report patient no showed appointment for today.

## 2020-11-15 ENCOUNTER — Telehealth: Payer: Self-pay | Admitting: Internal Medicine

## 2020-11-15 NOTE — Telephone Encounter (Signed)
Faxed back response requested to unknown pharmacy faxed on 11-15-20

## 2020-11-16 ENCOUNTER — Telehealth: Payer: Self-pay | Admitting: Pharmacist

## 2020-11-16 ENCOUNTER — Telehealth: Payer: Medicare Other

## 2020-11-16 NOTE — Telephone Encounter (Signed)
  Chronic Care Management   Note  11/16/2020 Name: Kristen Bailey MRN: [id number] DOB: 01-26-1933   Patient reported to [facility], CPhT today that " a fever blister medication was called in for her for a fever that is on her lip. She states that it is not helping at all. She informs that her lip hurts and is sore. She has been using vaseline" and patient is asking for alternative treatment recommendations.   I do not see where any [facility] provider has recently sent a script for anything for a fever blister.   Attempted to call patient, phone rang for several minutes and I was unable to leave a voicemail.   Routing to PCP and CMA for follow up

## 2020-11-17 NOTE — Telephone Encounter (Signed)
Spoke with Patient and she states the pharmacy sent her a tube of over the counter Abreva for some fever blisters that are on her upper lip. Patient states that she does not have a history of fever blisters, she has not had this since she was a little girl.   Patient states this did not help but she is now using Vaseline and states it is starting to heal. States no one around her has any fever blisters. She declined an appointment at this time.   I informed the Patient that if this was not better by Monday to call back and I would get her scheduled.

## 2020-11-17 NOTE — Telephone Encounter (Signed)
If fever blisters not healing I can Rx valtrex Monday  Let me know

## 2020-11-17 NOTE — Telephone Encounter (Signed)
No answer, no voicemail.

## 2020-11-20 ENCOUNTER — Telehealth: Payer: Medicare Other

## 2020-11-20 ENCOUNTER — Telehealth: Payer: Self-pay | Admitting: Pharmacist

## 2020-11-20 NOTE — Telephone Encounter (Signed)
  Chronic Care Management   Note  11/20/2020 Name: Kristen Bailey MRN: [id number] DOB: 10/14/1933   Attempted to contact patient for scheduled appointment for medication management support. Left HIPAA compliant message for patient to return my call at their convenience.    Plan: - If I do not hear back from the patient by end of business today, will collaborate with Care Guide to outreach to schedule follow up with me  Catie Darnelle Maffucci, PharmD, [place], [place] Pharmacist [place] [phone]

## 2020-11-20 NOTE — Telephone Encounter (Signed)
Patient states this has cleared up

## 2020-11-23 DIAGNOSIS — J449 Chronic obstructive pulmonary disease, unspecified: Secondary | ICD-10-CM | POA: Diagnosis not present

## 2020-11-24 NOTE — Telephone Encounter (Signed)
Pt has been r/s  

## 2020-11-27 ENCOUNTER — Telehealth: Payer: Self-pay | Admitting: Internal Medicine

## 2020-11-27 NOTE — Telephone Encounter (Signed)
Patient called in wanted to know when she could get her booster shot

## 2020-11-27 NOTE — Telephone Encounter (Signed)
Tried to call but patient uses a screening program and it does not accept our calls.

## 2020-11-30 ENCOUNTER — Ambulatory Visit: Payer: Medicare Other

## 2020-12-04 ENCOUNTER — Telehealth: Payer: Medicare Other

## 2020-12-13 ENCOUNTER — Ambulatory Visit: Payer: Medicare Other | Admitting: Internal Medicine

## 2020-12-20 ENCOUNTER — Telehealth: Payer: Self-pay | Admitting: Pharmacist

## 2020-12-20 ENCOUNTER — Telehealth: Payer: Medicare Other

## 2020-12-20 NOTE — Telephone Encounter (Signed)
  Chronic Care Management   Note  12/20/2020 Name: Kristen Bailey MRN: [id number] DOB: Jun 24, 1933   Attempted to contact patient for scheduled appointment for medication management support. Appears her phone has a call blocker that is not accepting calls from [facility] [phone]).   Called patient's emergency contact, Debbie. Left HIPAA compliant message noting that I was trying to get in touch with patient.     Plan: - If I do not hear back from the patient by end of business today, will collaborate with Care Guide to outreach to schedule follow up with me   Catie Feliz Beam, PharmD, [place], CPP Clinical Pharmacist [employer] at [employer] [phone]

## 2020-12-22 NOTE — Telephone Encounter (Signed)
Pt called back returning a call to schedule with Catie

## 2020-12-24 DIAGNOSIS — J449 Chronic obstructive pulmonary disease, unspecified: Secondary | ICD-10-CM | POA: Diagnosis not present

## 2020-12-25 NOTE — Telephone Encounter (Signed)
Patient returned call. F/u scheduled

## 2020-12-25 NOTE — Telephone Encounter (Signed)
Received VM from patient's daughter, Jackelyn Poling. Returned her call at [phone] (her cell as requested). LVM.

## 2020-12-25 NOTE — Telephone Encounter (Signed)
Pt called returning a call  °

## 2020-12-26 ENCOUNTER — Telehealth: Payer: Self-pay | Admitting: Internal Medicine

## 2020-12-26 ENCOUNTER — Ambulatory Visit: Payer: Medicare Other | Admitting: Pharmacist

## 2020-12-26 DIAGNOSIS — I739 Peripheral vascular disease, unspecified: Secondary | ICD-10-CM

## 2020-12-26 DIAGNOSIS — I10 Essential (primary) hypertension: Secondary | ICD-10-CM

## 2020-12-26 DIAGNOSIS — E785 Hyperlipidemia, unspecified: Secondary | ICD-10-CM

## 2020-12-26 DIAGNOSIS — N1831 Chronic kidney disease, stage 3a: Secondary | ICD-10-CM

## 2020-12-26 DIAGNOSIS — R197 Diarrhea, unspecified: Secondary | ICD-10-CM

## 2020-12-26 DIAGNOSIS — J449 Chronic obstructive pulmonary disease, unspecified: Secondary | ICD-10-CM

## 2020-12-26 DIAGNOSIS — R110 Nausea: Secondary | ICD-10-CM

## 2020-12-26 DIAGNOSIS — D0511 Intraductal carcinoma in situ of right breast: Secondary | ICD-10-CM

## 2020-12-26 NOTE — Telephone Encounter (Signed)
Patience was returning your call he missed

## 2020-12-26 NOTE — Patient Instructions (Signed)
Visit Information  Patient Care Plan: Medication Management    Problem Identified: Breast cancer, COPD, HLD     [place] Goal: Disease Progression Prevention   Start Date: 12/26/2020  This Visit's Progress: On track  Priority: High  Note:   Current Barriers:  . Unable to independently afford treatment regimen . Complex patient with multiple comorbidities including recent breast cancer diagnosis, osteoporosis, COPD, that place at high risk of disease progression  Pharmacist Clinical Goal(s):  Marland Kitchen Over the next 90 days, patient will achieve adherence to monitoring guidelines and medication adherence to achieve therapeutic efficacy through collaboration with PharmD and provider.   Interventions: . 1:1 collaboration with McLean-Scocuzza, Nino Glow, MD regarding development and update of comprehensive plan of care as evidenced by provider attestation and co-signature . Inter-disciplinary care team collaboration (see longitudinal plan of care) . Comprehensive medication review performed; medication list updated in electronic medical record  Health Maintenance: . Recently set up a call blocker to try to prevent scam calls. Has been blocking our office phone sometimes. She thinks she has this fixed now . Missed appointment for Prevnar vaccine. Rescheduled this and fasting lab appointment to occur at the same time next month, saving patient a trip to our office.  . Reviewed all upcoming appointments and times with patient.   Ductal Carcinoma in Situ, R breast: . Management plan determination in process; upcoming appt w/ surgeon for opinion. Follows w/ Dr. Grayland Ormond, placed on tamoxifen 60 mg daily, plan for 5 years of tx . Patient does indicate increased fatigue, wonders if related to other medications . Encouraged continued collaboration with multidiscplinary team. Reviewed patient medication regimen for drug interactions with tamoxifen; none found. Reviewed with patient that tamoxifen may be  associated with nausea, but she has had complaints of intermittent nausea since at least September when she requested an ondansetron refill from PCP. Continue to follow.  . Reviewed upcoming appointments and ensured transportation  Chronic Obstructive Pulmonary Disease: Marland Kitchen Moderately well managed; current treatment: Breztri 160/9/4.5 mcg 2 puffs BID. Previously on Symbicort + Spiriva; collaborated w/ pulmonology Dr. Raul Del to consolidate to improve affordability. Patient does note that she continues to require albuterol HFA or levalbuterol nebulizer at least 1-2 times daily . GOLD Classification: likely D given symptom burden . Encouraged continued collaboration with pulmonary. Due to reapply for [facility] assistance through [place]. Collaborated w/ patient to complete online application. Reviewed that when she is down to 1 inhaler to contact [facility] for refill. She verbalized understanding. Will collaborate w/ CPhT on 2022 f/u. Marland Kitchen Patient asks about ads she saw for "COPD gummies" and "vitamin + fruit capsules" and if they would be beneficial. Reviewed that these types of supplements have not been adequately studied and are not FDA regulated. Reviewed to focus on incorporating fresh fruits and vegetables into her regular diet as able.   Hypertension: . Controlled; current treatment: lisinopril/HCTZ 10/12.5 mg QAM, amlodipine 5 mg daily; follows w/ Dr. Humphrey Rolls . Current home readings: not checking regularly, has been overwhelmed with other health care considerations lately . Recommended to continue current regimen with collaboration w/ cardiology and primary care  Hyperlipidemia: . Uncontrolled, but more relaxed goal likely appropriate given age, comorbidities, and anticipated benefit; current treatment: ezetimibe 10 mg daily   . Medications previously tried: atorvastatin, lovastatin d/t itching, rash, GI tolerability . Continue current regimen at this time. Ezetimibe can be associated with GI upset, but  patient's complaints of nausea outdate the start of ezetimibe therapy, and she does not  seem to perceive any worsening. Continue current regimen at this itme  Osteoporosis: . Appropriately managed; current regimen: Prolia Q39months; calcium + Vitamin D 3 daily.  . Patient does note she just paid for her last Prolia injection and is questioning whether the cost is worth it.  . Reviewed that tamoxifen therapy can worsen bone density, thereby further increasing risk of fractures if she were to have a fall. Encouraged to continue Prolia therapy at this time. Reviewed that she will be receiving inhaler therapy for free this year, in comparison to her total drug costs for last year. She verbalized understanding  Chronic diarrhea/nausea/constipation: . Hx gall bladder removal; patient notes previous appts w/ Dr. Vicente Males. Notes improvement in diarrhea w/ daily cholestyramine packets. Occasional use of ondansetron for nausea. Has been intermittent since at least September . She notes that her children think she "doesn't eat right". Reports breakfast of cheese toast, sometimes grits, sometimes oatmeal. Lunch/Supper: sometimes eats the meals provided by the retirement facility, but notes that they often "aren't seasoned" so she will eat other things that she has around the house. Reports intake of proteins, variable vegetables.  . Encouraged continued follow up with gastroenterology and communication w/ GI and PCP regarding symptoms. Encouraged communication if any change in appetite.   Patient Goals/Self-Care Activities . Over the next 90 days, patient will:  - take medications as prescribed check blood pressure periodically, document, and provide at future appointments collaborate with provider on medication access solutions  Follow Up Plan: Telephone follow up appointment with care management team member scheduled for: ~ 6 weeks     The patient verbalized understanding of instructions, educational  materials, and care plan provided today and declined offer to receive copy of patient instructions, educational materials, and care plan.   Plan: Telephone follow up appointment with care management team member scheduled for:  ~ 6 weeks  Catie Darnelle Maffucci, PharmD, [place], [place] Clinical Pharmacist [employer] at [employer] [phone]

## 2020-12-26 NOTE — Chronic Care Management (AMB) (Signed)
Chronic Care Management Pharmacy Note  12/26/2020 Name:  Kristen Bailey MRN:  [id number] DOB:  1933/02/11  Subjective: Kristen Bailey is an 87 y.o. year old female who is a primary patient of McLean-Scocuzza, Pasty Spillers, MD.  The CCM team was consulted for assistance with disease management and care coordination needs.    Engaged with patient by telephone for follow up visit in response to provider referral for pharmacy case management and/or care coordination services.   Consent to Services:  The patient was given information about Chronic Care Management services, agreed to services, and gave verbal consent prior to initiation of services.  Please see initial visit note for detailed documentation.   Objective:  Lab Results  Component Value Date   CREATININE 0.91 09/28/2020   CREATININE 0.91 02/23/2020   CREATININE 0.95 08/17/2019    Lab Results  Component Value Date   HGBA1C 5.6 08/17/2019       Component Value Date/Time   CHOL 245 (H) 09/28/2020 0913   TRIG 114.0 09/28/2020 0913   HDL 103.20 09/28/2020 0913   CHOLHDL 2 09/28/2020 0913   VLDL 22.8 09/28/2020 0913   LDLCALC 119 (H) 09/28/2020 0913     BP Readings from Last 3 Encounters:  10/26/20 116/66  10/20/20 134/70  10/10/20 (!) 160/75    Assessment/Interventions: Review of patient past medical history, allergies, medications, health status, including review of consultants reports, laboratory and other test data, was performed as part of comprehensive evaluation and provision of chronic care management services.   SDOH:  (Social Determinants of Health) assessments and interventions performed:  SDOH Interventions   Flowsheet Row Most Recent Value  SDOH Interventions   Financial Strain Interventions Other (Comment)  [manufacturer assistance]  Transportation Interventions Intervention Not Indicated      CCM Care Plan  Allergies  Allergen Reactions  . Trelegy Ellipta  [Fluticasone-Umeclidin-Vilant]     Shortness of breath  . Codeine Nausea And Vomiting  . Lipitor [Atorvastatin] Nausea And Vomiting  . Omeprazole     unknown  . Vicodin [Hydrocodone-Acetaminophen]     unknown  . Zyban [Bupropion]     unknown  . Mevacor [Lovastatin] Itching and Rash    Medications Reviewed Today    Reviewed by Lourena Simmonds, RPH-CPP (Pharmacist) on 12/26/20 at 1230  Med List Status: <None>  Medication Order Taking? Sig Documenting Provider Last Dose Status Informant  acetaminophen (TYLENOL) 500 MG tablet [id number] Yes Take 500 mg by mouth every 6 (six) hours as needed. [provider] Taking Active Self  albuterol (VENTOLIN HFA) 108 (90 Base) MCG/ACT inhaler [id number] Yes Inhale 1-2 puffs into the lungs every 6 (six) hours as needed for wheezing or shortness of breath. [provider] Taking Active   amLODipine (NORVASC) 5 MG tablet [id number] Yes Take 1 tablet (5 mg total) by mouth daily. McLean-Scocuzza, Pasty Spillers, MD Taking Active   Budeson-Glycopyrrol-Formoterol (BREZTRI AEROSPHERE) 160-9-4.8 MCG/ACT AERO [id number] Yes Inhale 2 puffs into the lungs in the morning and at bedtime. [provider] Taking Active   calcium carbonate (OSCAL) 1500 (600 Ca) MG TABS tablet [id number] Yes Take 1,500 mg by mouth 2 (two) times daily with a meal.  [provider] Taking Active Self  Cholecalciferol 25 MCG (1000 UT) tablet [id number] Yes Take 1,000 Units by mouth daily.  [provider] Taking Active Self           Med Note Deretha Emory, [facility]   Tue Jun 24, 2017 11:09 AM)  cholestyramine (QUESTRAN) 4 g packet [id number] Yes Take 1 packet (4 g total) by mouth daily before breakfast for 1 dose. As needed McLean-Scocuzza, Pasty Spillers, MD Taking Expired 09/29/20 2359   denosumab (PROLIA) 60 MG/ML SOSY injection [id number] Yes Inject 60 mg into the skin every 6 (six) months. McLean-Scocuzza, Pasty Spillers, MD Taking Active   ezetimibe (ZETIA) 10 MG  tablet [id number] Yes Take 1 tablet (10 mg total) by mouth daily. McLean-Scocuzza, Pasty Spillers, MD Taking Active   fluticasone [facility]) 50 MCG/ACT nasal spray [id number] Yes Place into the nose daily as needed.  [provider] Taking Active   hydrOXYzine (ATARAX/VISTARIL) 25 MG tablet [id number] No Take 1 tablet (25 mg total) by mouth 2 (two) times daily as needed. Try at night first. Please deliver. Stop allegra  Patient not taking: Reported on 12/26/2020   McLean-Scocuzza, Pasty Spillers, MD Not Taking Active   levalbuterol Pauline Aus) 0.63 MG/3ML nebulizer solution [id number] Yes Take 0.63 mg by nebulization every 4 (four) hours as needed for wheezing or shortness of breath. [provider] Taking Active   lisinopril-hydrochlorothiazide (ZESTORETIC) 10-12.5 MG tablet [id number] Yes Take 1 tablet by mouth daily.  [provider] Taking Active Self           Med Note Deretha Emory, KIMBERLY   Tue Jun 24, 2017 11:09 AM)    LORazepam (ATIVAN) 1 MG tablet [id number] Yes Take 1 mg by mouth 2 (two) times daily.  [provider] Taking Active Self           Med Note Feliz Beam, Arie Sabina   Fri Sep 29, 2020  2:38 PM) Taking some mornings, but every evening  Magnesium 400 MG TABS [id number] Yes Take by mouth daily. [provider] Taking Active   Omega-3 Fatty Acids (FISH OIL) 1000 MG CAPS [id number] Yes Take 1 capsule by mouth daily. [provider] Taking Active   ondansetron (ZOFRAN) 4 MG tablet [id number] Yes Take 1 tablet (4 mg total) by mouth every 8 (eight) hours as needed for nausea or vomiting. McLean-Scocuzza, Pasty Spillers, MD Taking Active   potassium chloride SA (KLOR-CON) 20 MEQ tablet [id number] Yes Take 1 tablet (20 mEq total) by mouth daily. McLean-Scocuzza, Pasty Spillers, MD Taking Active   tamoxifen (NOLVADEX) 20 MG tablet [id number] Yes Take 1 tablet (20 mg total) by mouth daily. Jeralyn Ruths, MD Taking Active           Patient Active Problem List   Diagnosis  Date Noted  . Insomnia 10/30/2020  . History of right breast cancer 10/26/2020  . Supplemental oxygen dependent 10/10/2020  . 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 Osteoporosis 09/13/2020  . Hives 04/11/2020  . IBS (irritable bowel syndrome) 08/04/2019  . Hemorrhoids 08/04/2019  . Anxiety 08/04/2019  . Leg cramps 08/04/2019  . Hypomagnesemia 08/04/2019  . Nausea 08/04/2019  . Hypokalemia 08/04/2019  . PAD (peripheral artery disease) (HCC) 11/22/2016  . Pain in limb 11/22/2016  . COPD (chronic obstructive pulmonary disease) (HCC) 11/22/2016  . Hyperlipidemia 11/22/2016  . Essential hypertension 11/22/2016    Conditions to be addressed/monitored:  HTN, HLD and COPD  Patient Care Plan: Medication Management    Problem Identified: Breast cancer, COPD, HLD     [place] Goal: Disease Progression Prevention   Start Date: 12/26/2020  This Visit's Progress: On track  Priority: High  Note:   Current Barriers:  . Unable to independently afford treatment regimen . Complex patient with multiple comorbidities including  recent breast cancer diagnosis, osteoporosis, COPD, that place at high risk of disease progression  Pharmacist Clinical Goal(s):  Marland Kitchen Over the next 90 days, patient will achieve adherence to monitoring guidelines and medication adherence to achieve therapeutic efficacy through collaboration with PharmD and provider.   Interventions: . 1:1 collaboration with McLean-Scocuzza, Nino Glow, MD regarding development and update of comprehensive plan of care as evidenced by provider attestation and co-signature . Inter-disciplinary care team collaboration (see longitudinal plan of care) . Comprehensive medication review performed; medication list updated in electronic medical record  Health Maintenance: . Recently set up a call blocker to try to prevent scam calls. Has been blocking our office phone sometimes. She thinks she has this  fixed now . Missed appointment for Prevnar vaccine. Rescheduled this and fasting lab appointment to occur at the same time next month, saving patient a trip to our office.  . Reviewed all upcoming appointments and times with patient.   Ductal Carcinoma in Situ, R breast: . Management plan determination in process; upcoming appt w/ surgeon for opinion. Follows w/ Dr. Grayland Ormond, placed on tamoxifen 60 mg daily, plan for 5 years of tx . Patient does indicate increased fatigue, wonders if related to other medications . Encouraged continued collaboration with multidiscplinary team. Reviewed patient medication regimen for drug interactions with tamoxifen; none found. Reviewed with patient that tamoxifen may be associated with nausea, but she has had complaints of intermittent nausea since at least September when she requested an ondansetron refill from PCP. Continue to follow.  . Reviewed upcoming appointments and ensured transportation  Chronic Obstructive Pulmonary Disease: Marland Kitchen Moderately well managed; current treatment: Breztri 160/9/4.5 mcg 2 puffs BID. Previously on Symbicort + Spiriva; collaborated w/ pulmonology Dr. Raul Del to consolidate to improve affordability. Patient does note that she continues to require albuterol HFA or levalbuterol nebulizer at least 1-2 times daily . GOLD Classification: likely D given symptom burden . Encouraged continued collaboration with pulmonary. Due to reapply for [facility] assistance through [place]. Collaborated w/ patient to complete online application. Reviewed that when she is down to 1 inhaler to contact [facility] for refill. She verbalized understanding. Will collaborate w/ CPhT on 2022 f/u. Marland Kitchen Patient asks about ads she saw for "COPD gummies" and "vitamin + fruit capsules" and if they would be beneficial. Reviewed that these types of supplements have not been adequately studied and are not FDA regulated. Reviewed to focus on incorporating fresh fruits and vegetables into  her regular diet as able.   Hypertension: . Controlled; current treatment: lisinopril/HCTZ 10/12.5 mg QAM, amlodipine 5 mg daily; follows w/ Dr. Humphrey Rolls . Current home readings: not checking regularly, has been overwhelmed with other health care considerations lately . Recommended to continue current regimen with collaboration w/ cardiology and primary care  Hyperlipidemia: . Uncontrolled, but more relaxed goal likely appropriate given age, comorbidities, and anticipated benefit; current treatment: ezetimibe 10 mg daily   . Medications previously tried: atorvastatin, lovastatin d/t itching, rash, GI tolerability . Continue current regimen at this time. Ezetimibe can be associated with GI upset, but patient's complaints of nausea outdate the start of ezetimibe therapy, and she does not seem to perceive any worsening. Continue current regimen at this itme  Osteoporosis: . Appropriately managed; current regimen: Prolia Q60months; calcium + Vitamin D 3 daily.  . Patient does note she just paid for her last Prolia injection and is questioning whether the cost is worth it.  . Reviewed that tamoxifen therapy can worsen bone density, thereby further increasing  risk of fractures if she were to have a fall. Encouraged to continue Prolia therapy at this time. Reviewed that she will be receiving inhaler therapy for free this year, in comparison to her total drug costs for last year. She verbalized understanding  Chronic diarrhea/nausea/constipation: . Hx gall bladder removal; patient notes previous appts w/ Dr. Vicente Males. Notes improvement in diarrhea w/ daily cholestyramine packets. Occasional use of ondansetron for nausea. Has been intermittent since at least September . She notes that her children think she "doesn't eat right". Reports breakfast of cheese toast, sometimes grits, sometimes oatmeal. Lunch/Supper: sometimes eats the meals provided by the retirement facility, but notes that they often "aren't seasoned"  so she will eat other things that she has around the house. Reports intake of proteins, variable vegetables.  . Encouraged continued follow up with gastroenterology and communication w/ GI and PCP regarding symptoms. Encouraged communication if any change in appetite.   Patient Goals/Self-Care Activities . Over the next 90 days, patient will:  - take medications as prescribed check blood pressure periodically, document, and provide at future appointments collaborate with provider on medication access solutions  Follow Up Plan: Telephone follow up appointment with care management team member scheduled for: ~ 6 weeks     Medication Assistance: Application for Breztri medication assistance program in process. Anticipated assistance start date TBD. See plan of care below for additional detail.   Follow Up:  Patient agrees to Care Plan and Follow-up.  Plan: Telephone follow up appointment with care management team member scheduled for:  ~ 6 weeks  Catie Darnelle Maffucci, PharmD, [place], [place] Clinical Pharmacist [employer] at [employer] [phone]

## 2020-12-27 ENCOUNTER — Telehealth: Payer: Self-pay

## 2020-12-27 ENCOUNTER — Other Ambulatory Visit: Payer: Self-pay | Admitting: Internal Medicine

## 2020-12-27 MED ORDER — LISINOPRIL-HYDROCHLOROTHIAZIDE 10-12.5 MG PO TABS: 1.0000 | ORAL_TABLET | Freq: Every day | ORAL | 3 refills | Status: AC

## 2020-12-27 NOTE — Telephone Encounter (Signed)
Pt needs a refill on lisinopril-hydrochlorothiazide (ZESTORETIC) 10-12.[facility]. Pt has one pill left

## 2021-01-03 ENCOUNTER — Telehealth: Payer: Self-pay

## 2021-01-03 DIAGNOSIS — I10 Essential (primary) hypertension: Secondary | ICD-10-CM

## 2021-01-03 MED ORDER — AMLODIPINE BESYLATE 5 MG PO TABS: 5.0000 mg | ORAL_TABLET | Freq: Every day | ORAL | 3 refills | Status: DC

## 2021-01-03 NOTE — Telephone Encounter (Signed)
Pt needs refill for amlodipine sent to [facility].

## 2021-01-03 NOTE — Telephone Encounter (Signed)
Pt would like a call back. She states that she has some questions about some of her medications.

## 2021-01-03 NOTE — Telephone Encounter (Signed)
Attempted to call back. Received message that our phone number is being blocked.

## 2021-01-04 NOTE — Telephone Encounter (Signed)
  Chronic Care Management   Note  01/04/2021 Name: Kristen Bailey MRN: [id number] DOB: 06/10/1933   Attempted to contact patient. Calls from the office line are still being blocked.   Catie Darnelle Maffucci, PharmD, [place], [place] Clinical Pharmacist [employer] at [place]

## 2021-01-09 ENCOUNTER — Other Ambulatory Visit: Payer: Self-pay

## 2021-01-09 ENCOUNTER — Ambulatory Visit (INDEPENDENT_AMBULATORY_CARE_PROVIDER_SITE_OTHER): Payer: Medicare Other | Admitting: Surgery

## 2021-01-09 ENCOUNTER — Encounter: Payer: Self-pay | Admitting: Surgery

## 2021-01-09 VITALS — BP 194/78 | HR 92 | Temp 97.9°F | Ht 63.0 in | Wt 131.6 lb

## 2021-01-09 DIAGNOSIS — D0511 Intraductal carcinoma in situ of right breast: Secondary | ICD-10-CM | POA: Diagnosis not present

## 2021-01-09 NOTE — Patient Instructions (Signed)
Follow up here in September with an office visit.     Please call and ask to speak with a nurse if you develop questions or concerns.

## 2021-01-09 NOTE — Progress Notes (Signed)
Surgical Clinic Progress/Follow-up Note   HPI:  87 y.o. Female presents to clinic for DCIS follow-up  3 months follow the last evaluation. Patient reports improvement/resolution of prior issues.  Taking tamoxifen.   Review of Systems:  Constitutional: denies fever/chills  ENT: denies sore throat, hearing problems  Respiratory: denies any new shortness of breath, wheezing  Cardiovascular: denies chest pain, palpitations  Gastrointestinal: reports persisting IBS Skin: chronic rash   Vital Signs:  BP (!) 194/78   Pulse 92   Temp 97.9 F (36.6 C)   Ht 5\' 3"  (1.6 m)   Wt 131 lb 9.6 oz (59.7 kg)   SpO2 (!) 89%   BMI 23.31 kg/m    Physical Exam:  Constitutional:  -- Normal body habitus  -- Awake, alert, and oriented x3  Pulmonary:  -- No crackles -- Equal breath sounds bilaterally -- Breathing non-labored at rest Cardiovascular:  -- S1, S2 present  -- No pericardial rubs  Gastrointestinal:  -- Soft and non-distended, non-tender/with no tenderness to palpation, no guarding/rebound tenderness  GU  -- bilateral breast exams without dominant nor suspicious nodularity, mass or skin changes.   Musculoskeletal / Integumentary:  -- Wounds or skin discoloration: None appreciated  -- Extremities: no edema   Laboratory studies: none  Imaging: No new pertinent imaging available for review   Assessment:  87 y.o. yo Female with a problem list including...  Patient Active Problem List   Diagnosis Date Noted  . Insomnia 10/30/2020  . History of right breast cancer 10/26/2020  . Supplemental oxygen dependent 10/10/2020  . 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 Osteoporosis 09/13/2020  . Hives 04/11/2020  . IBS (irritable bowel syndrome) 08/04/2019  . Hemorrhoids 08/04/2019  . Anxiety 08/04/2019  . Leg cramps 08/04/2019  . Hypomagnesemia 08/04/2019  . Nausea 08/04/2019  . Hypokalemia 08/04/2019  . PAD (peripheral artery disease) ([place])  11/22/2016  . Pain in limb 11/22/2016  . COPD (chronic obstructive pulmonary disease) ([place]) 11/22/2016  . Hyperlipidemia 11/22/2016  . Essential hypertension 11/22/2016    presents to clinic for follow-up evaluation of DCIS, stable, tolerating Tamoxifen well.  Plan:              - return to clinic 08/2021, or as needed, instructed to call office if any questions or concerns  All of the above recommendations were discussed with the patient and patient's family, and all of patient's and family's questions were answered to his/her/their expressed satisfaction.  Ronny Bacon, MD, FACS [place]: [facility] for exceptional care. Office: [phone]

## 2021-01-22 NOTE — Progress Notes (Signed)
[facility]  [phone] [phone]  ID: Kristen Bailey OB: 1933-10-14  MR#: [id number]  OZD#:[id number]  Patient Care Team: McLean-Scocuzza, Nino Glow, MD as PCP - General (Internal Medicine) De Hollingshead, RPH-CPP (Pharmacist) Theodore Demark, RN as Oncology Nurse Navigator Grayland Ormond, Kathlene November, MD as Consulting Physician (Oncology)  CHIEF COMPLAINT: DCIS, right breast.  INTERVAL HISTORY: Patient returns to clinic today for routine 64-month evaluation.  She is tolerating tamoxifen well without significant side effects.  She has chronic shortness of breath and requires oxygen, but otherwise feels well.  She has no neurologic complaints.  She denies any recent fevers or illnesses.  She has a good appetite and denies weight loss.  She has no chest pain, cough, or hemoptysis.  She denies any nausea, vomiting, constipation, or diarrhea.  She has no urinary complaints.  Patient offers no further specific complaints today.    REVIEW OF SYSTEMS:   Review of Systems  Constitutional: Negative.  Negative for fever, malaise/fatigue and weight loss.  Respiratory: Positive for shortness of breath. Negative for cough and hemoptysis.   Cardiovascular: Negative.  Negative for chest pain and leg swelling.  Gastrointestinal: Negative.  Negative for abdominal pain.  Genitourinary: Negative.  Negative for dysuria.  Musculoskeletal: Negative.  Negative for back pain.  Skin: Negative.  Negative for rash.  Neurological: Negative.  Negative for dizziness, focal weakness, weakness and headaches.  Psychiatric/Behavioral: Negative.  The patient is not nervous/anxious.     As per HPI. Otherwise, a complete review of systems is negative.  PAST MEDICAL HISTORY: Past Medical History:  Diagnosis Date  . Anxiety   . Chicken pox   . COPD (chronic obstructive pulmonary disease) ([place])   . Essential tremor   . Hypertension   . Hypomagnesemia   . Irritable bowel  syndrome (IBS)   . 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 ABDOMINAL HYSTERECTOMY    . APPENDECTOMY    . BREAST BIOPSY Right 10/05/2020   stereo bx, x-clip, path pending   . CHOLECYSTECTOMY    . OVARIAN CYST REMOVAL    . SMALL INTESTINE SURGERY      FAMILY HISTORY: Family History  Problem Relation Age of Onset  . Heart attack Mother   . Heart disease Mother   . Hypertension Mother   . Cancer Father        bladder smoker  . Heart disease Brother        MI in 84s   . Breast cancer Neg Hx     ADVANCED DIRECTIVES (Y/N):  N  HEALTH MAINTENANCE: Social History   Tobacco Use  . Smoking status: Former Research scientist (life sciences)  . Smokeless tobacco: Never Used  Substance Use Topics  . Alcohol use: No  . Drug use: No     Colonoscopy:  PAP:  Bone density:  Lipid panel:  Allergies  Allergen Reactions  . Trelegy Ellipta [Fluticasone-Umeclidin-Vilant]     Shortness of breath  . Codeine Nausea And Vomiting  . Lipitor [Atorvastatin] Nausea And Vomiting  . Omeprazole     unknown  . Vicodin [Hydrocodone-Acetaminophen]     unknown  . Zyban [Bupropion]     unknown  . Mevacor [Lovastatin] Itching and Rash    Current Outpatient Medications  Medication Sig Dispense Refill  . acetaminophen (TYLENOL) 500 MG tablet Take 500  mg by mouth every 6 (six) hours as needed.    Marland Kitchen albuterol (VENTOLIN HFA) 108 (90 Base) MCG/ACT inhaler Inhale 1-2 puffs into the lungs every 6 (six) hours as needed for wheezing or shortness of breath.    Marland Kitchen amLODipine (NORVASC) 5 MG tablet Take 1 tablet (5 mg total) by mouth daily. 90 tablet 3  . Budeson-Glycopyrrol-Formoterol (BREZTRI AEROSPHERE) 160-9-4.8 MCG/ACT AERO Inhale 2 puffs into the lungs in the morning and at bedtime.    . calcium carbonate (OSCAL) 1500 (600 Ca) MG TABS tablet Take 1,500 mg by mouth 2 (two)  times daily with a meal.     . Cholecalciferol 25 MCG (1000 UT) tablet Take 1,000 Units by mouth daily.     Marland Kitchen denosumab (PROLIA) 60 MG/ML SOSY injection Inject 60 mg into the skin every 6 (six) months.    . ezetimibe (ZETIA) 10 MG tablet Take 1 tablet (10 mg total) by mouth daily. 30 tablet 2  . fluticasone (FLONASE) 50 MCG/ACT nasal spray Place into the nose daily as needed.     . hydrOXYzine (ATARAX/VISTARIL) 25 MG tablet Take 1 tablet (25 mg total) by mouth 2 (two) times daily as needed. Try at night first. Please deliver. Stop allegra 60 tablet 2  . levalbuterol (XOPENEX) 0.63 MG/3ML nebulizer solution Take 0.63 mg by nebulization every 4 (four) hours as needed for wheezing or shortness of breath.    . lisinopril-hydrochlorothiazide (ZESTORETIC) 10-12.5 MG tablet Take 1 tablet by mouth daily. 90 tablet 3  . LORazepam (ATIVAN) 1 MG tablet Take 1 mg by mouth 2 (two) times daily.     . Magnesium 400 MG TABS Take by mouth daily.    . Omega-3 Fatty Acids (FISH OIL) 1000 MG CAPS Take 1 capsule by mouth daily.    . ondansetron (ZOFRAN) 4 MG tablet Take 1 tablet (4 mg total) by mouth every 8 (eight) hours as needed for nausea or vomiting. 90 tablet 1  . potassium chloride SA (KLOR-CON) 20 MEQ tablet Take 1 tablet (20 mEq total) by mouth daily. 90 tablet 1  . tamoxifen (NOLVADEX) 20 MG tablet Take 1 tablet (20 mg total) by mouth daily. 90 tablet 3  . cholestyramine (QUESTRAN) 4 g packet Take 1 packet (4 g total) by mouth daily before breakfast for 1 dose. As needed 60 each 5   No current facility-administered medications for this visit.    OBJECTIVE: There were no vitals filed for this visit.   There is no height or weight on file to calculate BMI.    ECOG FS:2 - Symptomatic, <50% confined to bed  General: Well-developed, well-nourished, no acute distress.  Sitting in a wheelchair. Eyes: Pink conjunctiva, anicteric sclera. HEENT: Normocephalic, moist mucous membranes. Lungs: No audible wheezing  or coughing. Heart: Regular rate and rhythm. Abdomen: Soft, nontender, no obvious distention. Musculoskeletal: No edema, cyanosis, or clubbing. Neuro: Alert, answering all questions appropriately. Cranial nerves grossly intact. Skin: No rashes or petechiae noted. Psych: Normal affect.   LAB RESULTS:  Lab Results  Component Value Date   NA 140 01/23/2021   K 3.7 01/23/2021   CL 100 01/23/2021   CO2 34 (H) 01/23/2021   GLUCOSE 106 (H) 01/23/2021   BUN 27 (H) 01/23/2021   CREATININE 0.93 01/23/2021   CALCIUM 9.0 01/23/2021   PROT 6.3 01/23/2021   ALBUMIN 4.0 01/23/2021   AST 17 01/23/2021   ALT 11 01/23/2021   ALKPHOS 32 (L) 01/23/2021   BILITOT 1.1 01/23/2021   GFRNONAA >60  05/05/2019   GFRAA >60 05/05/2019    Lab Results  Component Value Date   WBC 6.1 01/23/2021   NEUTROABS 3.5 01/23/2021   HGB 13.1 01/23/2021   HCT 40.2 01/23/2021   MCV 92.0 01/23/2021   PLT 237.0 01/23/2021     STUDIES: No results found.  ASSESSMENT: DCIS, right breast.  PLAN:    1. DCIS, right breast: Pathology and imaging results reviewed independently.  Consultation with surgery determined that lumpectomy or mastectomy would be too high risk.  Is also determined that without undergoing surgery, XRT would offer little benefit.  Patient was initiated on tamoxifen November 2021 plan to take a minimum of 5 years of treatment, but likely will extend for several more years after given her high risk of recurrence.  Repeat right diagnostic mammogram in 3 months with follow-up 1 to 2 days later.    I spent a total of 20 minutes reviewing chart data, face-to-face evaluation with the patient, counseling and coordination of care as detailed above.   Patient expressed understanding and was in agreement with this plan. She also understands that She can call clinic at any time with any questions, concerns, or complaints.   Cancer Staging Ductal carcinoma in situ (DCIS) of right breast with  comedonecrosis Staging form: Breast, AJCC 8th Edition - Clinical stage from 10/10/2020: Stage 0 (cTis (DCIS), cN0, cM0, ER+) - Signed by Lloyd Huger, MD on 10/14/2020 Stage prefix: Initial diagnosis   Lloyd Huger, MD   01/26/2021 5:45 AM

## 2021-01-23 ENCOUNTER — Other Ambulatory Visit (INDEPENDENT_AMBULATORY_CARE_PROVIDER_SITE_OTHER): Payer: Medicare Other

## 2021-01-23 ENCOUNTER — Other Ambulatory Visit: Payer: Medicare Other

## 2021-01-23 ENCOUNTER — Ambulatory Visit (INDEPENDENT_AMBULATORY_CARE_PROVIDER_SITE_OTHER): Payer: Medicare Other

## 2021-01-23 ENCOUNTER — Other Ambulatory Visit: Payer: Self-pay

## 2021-01-23 DIAGNOSIS — E785 Hyperlipidemia, unspecified: Secondary | ICD-10-CM | POA: Diagnosis not present

## 2021-01-23 DIAGNOSIS — Z13818 Encounter for screening for other digestive system disorders: Secondary | ICD-10-CM

## 2021-01-23 DIAGNOSIS — D0511 Intraductal carcinoma in situ of right breast: Secondary | ICD-10-CM | POA: Diagnosis not present

## 2021-01-23 DIAGNOSIS — Z23 Encounter for immunization: Secondary | ICD-10-CM | POA: Diagnosis not present

## 2021-01-23 DIAGNOSIS — R197 Diarrhea, unspecified: Secondary | ICD-10-CM

## 2021-01-23 DIAGNOSIS — Z1389 Encounter for screening for other disorder: Secondary | ICD-10-CM

## 2021-01-23 LAB — CBC WITH DIFFERENTIAL/PLATELET
Basophils Absolute: 0.1 10*3/uL (ref 0.0–0.1)
Basophils Relative: 0.9 % (ref 0.0–3.0)
Eosinophils Absolute: 0.1 10*3/uL (ref 0.0–0.7)
Eosinophils Relative: 1.1 % (ref 0.0–5.0)
HCT: 40.2 % (ref 36.0–46.0)
Hemoglobin: 13.1 g/dL (ref 12.0–15.0)
Lymphocytes Relative: 34.1 % (ref 12.0–46.0)
Lymphs Abs: 2.1 10*3/uL (ref 0.7–4.0)
MCHC: 32.7 g/dL (ref 30.0–36.0)
MCV: 92 fl (ref 78.0–100.0)
Monocytes Absolute: 0.4 10*3/uL (ref 0.1–1.0)
Monocytes Relative: 7.3 % (ref 3.0–12.0)
Neutro Abs: 3.5 10*3/uL (ref 1.4–7.7)
Neutrophils Relative %: 56.6 % (ref 43.0–77.0)
Platelets: 237 10*3/uL (ref 150.0–400.0)
RBC: 4.37 Mil/uL (ref 3.87–5.11)
RDW: 13.2 % (ref 11.5–15.5)
WBC: 6.1 10*3/uL (ref 4.0–10.5)

## 2021-01-23 LAB — COMPREHENSIVE METABOLIC PANEL
ALT: 11 U/L (ref 0–35)
AST: 17 U/L (ref 0–37)
Albumin: 4 g/dL (ref 3.5–5.2)
Alkaline Phosphatase: 32 U/L — ABNORMAL LOW (ref 39–117)
BUN: 27 mg/dL — ABNORMAL HIGH (ref 6–23)
CO2: 34 mEq/L — ABNORMAL HIGH (ref 19–32)
Calcium: 9 mg/dL (ref 8.4–10.5)
Chloride: 100 mEq/L (ref 96–112)
Creatinine, Ser: 0.93 mg/dL (ref 0.40–1.20)
GFR: 56.18 mL/min — ABNORMAL LOW (ref >60.00)
Glucose, Bld: 106 mg/dL — ABNORMAL HIGH (ref 70–99)
Potassium: 3.7 mEq/L (ref 3.5–5.1)
Sodium: 140 mEq/L (ref 135–145)
Total Bilirubin: 1.1 mg/dL (ref 0.2–1.2)
Total Protein: 6.3 g/dL (ref 6.0–8.3)

## 2021-01-23 LAB — LIPID PANEL
Cholesterol: 215 mg/dL — ABNORMAL HIGH (ref 0–200)
HDL: 90.2 mg/dL (ref >39.00)
LDL Cholesterol: 105 mg/dL — ABNORMAL HIGH (ref 0–99)
NonHDL: 124.71
Total CHOL/HDL Ratio: 2
Triglycerides: 97 mg/dL (ref 0.0–149.0)
VLDL: 19.4 mg/dL (ref 0.0–40.0)

## 2021-01-23 NOTE — Progress Notes (Signed)
Patient presented for Prevnar injection to left deltoid, patient voiced no concerns nor showed any signs of distress during injection.

## 2021-01-23 NOTE — Addendum Note (Signed)
Addended by: Leeanne Rio on: 01/23/2021 10:25 AM   Modules accepted: Orders

## 2021-01-24 ENCOUNTER — Other Ambulatory Visit: Payer: Medicare Other

## 2021-01-24 DIAGNOSIS — J449 Chronic obstructive pulmonary disease, unspecified: Secondary | ICD-10-CM | POA: Diagnosis not present

## 2021-01-24 DIAGNOSIS — Z1389 Encounter for screening for other disorder: Secondary | ICD-10-CM | POA: Diagnosis not present

## 2021-01-24 DIAGNOSIS — Z9981 Dependence on supplemental oxygen: Secondary | ICD-10-CM | POA: Diagnosis not present

## 2021-01-24 DIAGNOSIS — R0600 Dyspnea, unspecified: Secondary | ICD-10-CM | POA: Diagnosis not present

## 2021-01-24 LAB — HEPATITIS C ANTIBODY
Hepatitis C Ab: NONREACTIVE
SIGNAL TO CUT-OFF: 0 (ref <1.00)

## 2021-01-25 ENCOUNTER — Inpatient Hospital Stay: Payer: Medicare Other | Attending: Oncology | Admitting: Oncology

## 2021-01-25 ENCOUNTER — Encounter: Payer: Self-pay | Admitting: Oncology

## 2021-01-25 DIAGNOSIS — Z79899 Other long term (current) drug therapy: Secondary | ICD-10-CM | POA: Diagnosis not present

## 2021-01-25 DIAGNOSIS — J449 Chronic obstructive pulmonary disease, unspecified: Secondary | ICD-10-CM | POA: Diagnosis not present

## 2021-01-25 DIAGNOSIS — Z87891 Personal history of nicotine dependence: Secondary | ICD-10-CM | POA: Insufficient documentation

## 2021-01-25 DIAGNOSIS — I10 Essential (primary) hypertension: Secondary | ICD-10-CM | POA: Diagnosis not present

## 2021-01-25 DIAGNOSIS — Z170 Estrogen receptor positive status [ER+]: Secondary | ICD-10-CM | POA: Insufficient documentation

## 2021-01-25 DIAGNOSIS — R0602 Shortness of breath: Secondary | ICD-10-CM | POA: Insufficient documentation

## 2021-01-25 DIAGNOSIS — D0511 Intraductal carcinoma in situ of right breast: Secondary | ICD-10-CM

## 2021-01-25 DIAGNOSIS — Z79810 Long term (current) use of selective estrogen receptor modulators (SERMs): Secondary | ICD-10-CM | POA: Insufficient documentation

## 2021-01-25 DIAGNOSIS — Z85828 Personal history of other malignant neoplasm of skin: Secondary | ICD-10-CM | POA: Diagnosis not present

## 2021-01-25 DIAGNOSIS — F419 Anxiety disorder, unspecified: Secondary | ICD-10-CM | POA: Insufficient documentation

## 2021-01-25 NOTE — Progress Notes (Signed)
Pt in for follow up, reports has had several MD appts week.  Pt also reports had breast exam at appt with surgeon on January 06, 2021.

## 2021-01-26 LAB — URINALYSIS, ROUTINE W REFLEX MICROSCOPIC
Bilirubin, UA: NEGATIVE
Glucose, UA: NEGATIVE
Ketones, UA: NEGATIVE
Leukocytes,UA: NEGATIVE
Nitrite, UA: NEGATIVE
Protein,UA: NEGATIVE
RBC, UA: NEGATIVE
Specific Gravity, UA: 1.015 (ref 1.005–1.030)
Urobilinogen, Ur: 0.2 mg/dL (ref 0.2–1.0)
pH, UA: 7 (ref 5.0–7.5)

## 2021-02-02 ENCOUNTER — Telehealth: Payer: Self-pay | Admitting: Pharmacist

## 2021-02-02 NOTE — Telephone Encounter (Signed)
Encounter faxed to Dr fleming's office via epic routing

## 2021-02-02 NOTE — Telephone Encounter (Signed)
If having increased sob f/u Dr. Raul Del asap and call office to see what to do with O2 turning up to 3L continuous ok but needs to monitor O2 and make sure <100% >90%    Fax this note to Dr. Raul Del please

## 2021-02-02 NOTE — Telephone Encounter (Signed)
Patient called, she knew she had missed calls from our office in the past several days. She is trying to get her phone fixed so that our office number isn't blocked.   Reviewed that Yves Dill had called to review lab work and also mailed lab work. I reviewed lab results with patient.   She notes that she has been consistently using 2L of O2 at home, will increase to 3 L when out and walking.   She had f/u with Dr. Raul Del on 2/9. Patient reported some nausea, and she wondered if related to [facility]. She was given a sample of Trelegy to try instead, though she notes that she prefers how she feels on Bevespi. I encouraged her to continue to communicate with pulmonology regarding which inhaler she thinks does better for her breathing.    Routing to PCP for [facility]

## 2021-02-06 ENCOUNTER — Ambulatory Visit: Payer: Medicare Other

## 2021-02-06 ENCOUNTER — Telehealth: Payer: Self-pay

## 2021-02-06 ENCOUNTER — Ambulatory Visit: Payer: Medicare Other | Admitting: Pharmacist

## 2021-02-06 DIAGNOSIS — E785 Hyperlipidemia, unspecified: Secondary | ICD-10-CM

## 2021-02-06 DIAGNOSIS — N1831 Chronic kidney disease, stage 3a: Secondary | ICD-10-CM

## 2021-02-06 DIAGNOSIS — J449 Chronic obstructive pulmonary disease, unspecified: Secondary | ICD-10-CM

## 2021-02-06 DIAGNOSIS — D0511 Intraductal carcinoma in situ of right breast: Secondary | ICD-10-CM

## 2021-02-06 DIAGNOSIS — I739 Peripheral vascular disease, unspecified: Secondary | ICD-10-CM

## 2021-02-06 NOTE — Progress Notes (Signed)
Chronic Care Management Pharmacy Note  02/06/2021 Name:  Kristen Bailey MRN:  [id number] DOB:  1933/12/13  Subjective: Kristen Bailey is an 86 y.o. year old female who is a primary patient of McLean-Scocuzza, Nino Glow, MD.  The CCM team was consulted for assistance with disease management and care coordination needs.    Engaged with patient by telephone for response to a call regarding medication management in response to provider referral for pharmacy case management and/or care coordination services.   Consent to Services:  The patient was given information about Chronic Care Management services, agreed to services, and gave verbal consent prior to initiation of services.  Please see initial visit note for detailed documentation.   Patient Care Team: McLean-Scocuzza, Nino Glow, MD as PCP - General (Internal Medicine) De Hollingshead, RPH-CPP (Pharmacist) Theodore Demark, RN as Oncology Nurse Navigator Lloyd Huger, MD as Consulting Physician (Oncology)  Recent office visits: None since our last discussion on 02/02/21  Recent consult visits: None since our last call  Hospital visits: None in previous 6 months  Objective:  Lab Results  Component Value Date   CREATININE 0.93 01/23/2021   BUN 27 (H) 01/23/2021   GFR 56.18 (L) 01/23/2021   GFRNONAA >60 05/05/2019   GFRAA >60 05/05/2019   NA 140 01/23/2021   K 3.7 01/23/2021   CALCIUM 9.0 01/23/2021   CO2 34 (H) 01/23/2021    Lab Results  Component Value Date/Time   HGBA1C 5.6 08/17/2019 09:02 AM   GFR 56.18 (L) 01/23/2021 09:25 AM   GFR 57.59 (L) 09/28/2020 09:13 AM    Last diabetic Eye exam: No results found for: HMDIABEYEEXA  Last diabetic Foot exam: No results found for: HMDIABFOOTEX   Lab Results  Component Value Date   CHOL 215 (H) 01/23/2021   HDL 90.20 01/23/2021   LDLCALC 105 (H) 01/23/2021   TRIG 97.0 01/23/2021   CHOLHDL 2 01/23/2021    Hepatic Function Latest Ref Rng & Units  01/23/2021 09/28/2020 02/23/2020  Total Protein 6.0 - 8.3 g/dL 6.3 6.4 6.6  Albumin 3.5 - 5.2 g/dL 4.0 4.2 4.0  AST 0 - 37 U/L $Remo'17 15 15  'ugDXh$ ALT 0 - 35 U/L $Remo'11 8 10  'TXoTT$ Alk Phosphatase 39 - 117 U/L 32(L) 53 52  Total Bilirubin 0.2 - 1.2 mg/dL 1.1 1.2 1.0    Lab Results  Component Value Date/Time   TSH 2.11 09/28/2020 09:13 AM   TSH 1.21 08/17/2019 09:02 AM    CBC Latest Ref Rng & Units 01/23/2021 09/28/2020 02/23/2020  WBC 4.0 - 10.5 K/uL 6.1 5.6 5.9  Hemoglobin 12.0 - 15.0 g/dL 13.1 13.2 12.6  Hematocrit 36.0 - 46.0 % 40.2 39.8 37.8  Platelets 150.0 - 400.0 K/uL 237.0 269.0 292.0    Lab Results  Component Value Date/Time   VD25OH 46.86 08/17/2019 09:02 AM    Clinical ASCVD: No  The ASCVD Risk score (Humnoke., et al., 2013) failed to calculate for the following reasons:   The 2013 ASCVD risk score is only valid for ages 16 to 4    Depression screen PHQ 2/9 04/11/2020 03/22/2020 02/04/2020  Decreased Interest 0 0 0  Down, Depressed, Hopeless 0 0 0  PHQ - 2 Score 0 0 0    Social History   Tobacco Use  Smoking Status Former Smoker  Smokeless Tobacco Never Used   BP Readings from Last 3 Encounters:  01/09/21 (!) 194/78  10/26/20 116/66  10/20/20 134/70   Pulse  Readings from Last 3 Encounters:  01/09/21 92  10/26/20 91  10/20/20 (!) 113   Wt Readings from Last 3 Encounters:  01/09/21 131 lb 9.6 oz (59.7 kg)  10/26/20 133 lb 6.4 oz (60.5 kg)  10/20/20 135 lb 8 oz (61.5 kg)    Assessment/Interventions: Review of patient past medical history, allergies, medications, health status, including review of consultants reports, laboratory and other test data, was performed as part of comprehensive evaluation and provision of chronic care management services.   SDOH:  (Social Determinants of Health) assessments and interventions performed: Yes SDOH Interventions   Flowsheet Row Most Recent Value  SDOH Interventions   Financial Strain Interventions Other (Comment)  [manufacturer  assistance]      CCM Care Plan  Allergies  Allergen Reactions  . Trelegy Ellipta [Fluticasone-Umeclidin-Vilant]     Shortness of breath  . Codeine Nausea And Vomiting  . Lipitor [Atorvastatin] Nausea And Vomiting  . Omeprazole     unknown  . Vicodin [Hydrocodone-Acetaminophen]     unknown  . Zyban [Bupropion]     unknown  . Mevacor [Lovastatin] Itching and Rash    Medications Reviewed Today    Reviewed by Levada Schilling, RN (Registered Nurse) on 01/25/21 at 1114  Med List Status: <None>  Medication Order Taking? Sig Documenting Provider Last Dose Status Informant  acetaminophen (TYLENOL) 500 MG tablet [id number]  Take 500 mg by mouth every 6 (six) hours as needed. [provider]  Active Self  albuterol (VENTOLIN HFA) 108 (90 Base) MCG/ACT inhaler [id number]  Inhale 1-2 puffs into the lungs every 6 (six) hours as needed for wheezing or shortness of breath. [provider]  Active   amLODipine (NORVASC) 5 MG tablet [id number]  Take 1 tablet (5 mg total) by mouth daily. McLean-Scocuzza, Nino Glow, MD  Active   Budeson-Glycopyrrol-Formoterol (BREZTRI AEROSPHERE) 160-9-4.8 MCG/ACT Hollie Salk [id number]  Inhale 2 puffs into the lungs in the morning and at bedtime. [provider]  Active   calcium carbonate (OSCAL) 1500 (600 Ca) MG TABS tablet [id number]  Take 1,500 mg by mouth 2 (two) times daily with a meal.  [provider]  Active Self  Cholecalciferol 25 MCG (1000 UT) tablet [id number]  Take 1,000 Units by mouth daily.  [provider]  Active Self           Med Note Josiah Lobo, KIMBERLY   Tue Jun 24, 2017 11:09 AM)    cholestyramine (QUESTRAN) 4 g packet [id number]  Take 1 packet (4 g total) by mouth daily before breakfast for 1 dose. As needed McLean-Scocuzza, Nino Glow, MD  Expired 09/29/20 2359   denosumab (PROLIA) 60 MG/ML SOSY injection [id number]  Inject 60 mg into the skin every 6 (six) months. McLean-Scocuzza, Nino Glow, MD  Active   ezetimibe  (ZETIA) 10 MG tablet [id number]  Take 1 tablet (10 mg total) by mouth daily. McLean-Scocuzza, Nino Glow, MD  Active   fluticasone [facility]) 50 MCG/ACT nasal spray [id number]  Place into the nose daily as needed.  [provider]  Active   hydrOXYzine (ATARAX/VISTARIL) 25 MG tablet [id number]  Take 1 tablet (25 mg total) by mouth 2 (two) times daily as needed. Try at night first. Please deliver. Stop allegra McLean-Scocuzza, Nino Glow, MD  Active   levalbuterol Penne Lash) 0.63 MG/3ML nebulizer solution [id number]  Take 0.63 mg by nebulization every 4 (four) hours as needed for wheezing or shortness of breath. [provider]  Active   lisinopril-hydrochlorothiazide (ZESTORETIC) 10-12.5 MG tablet [id number]  Take 1 tablet by mouth daily. McLean-Scocuzza, Nino Glow, MD  Active   LORazepam (ATIVAN) 1 MG tablet [id number]  Take 1 mg by mouth 2 (two) times daily.  [provider]  Active Self           Med Note Darnelle Maffucci, Arville Lime   Fri Sep 29, 2020  2:38 PM) Taking some mornings, but every evening  Magnesium 400 MG TABS [id number]  Take by mouth daily. [provider]  Active   Omega-3 Fatty Acids (FISH OIL) 1000 MG CAPS [id number]  Take 1 capsule by mouth daily. [provider]  Active   ondansetron (ZOFRAN) 4 MG tablet [id number]  Take 1 tablet (4 mg total) by mouth every 8 (eight) hours as needed for nausea or vomiting. McLean-Scocuzza, Nino Glow, MD  Active   potassium chloride SA (KLOR-CON) 20 MEQ tablet [id number]  Take 1 tablet (20 mEq total) by mouth daily. McLean-Scocuzza, Nino Glow, MD  Active   tamoxifen (NOLVADEX) 20 MG tablet [id number]  Take 1 tablet (20 mg total) by mouth daily. Lloyd Huger, MD  Active           Patient Active Problem List   Diagnosis Date Noted  . Insomnia 10/30/2020  . History of right breast cancer 10/26/2020  . Supplemental oxygen dependent 10/10/2020  . 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 Osteoporosis 09/13/2020  . Hives 04/11/2020  . IBS (irritable bowel syndrome) 08/04/2019  . Hemorrhoids 08/04/2019  . Anxiety 08/04/2019  . Leg cramps 08/04/2019  . Hypomagnesemia 08/04/2019  . Nausea 08/04/2019  . Hypokalemia 08/04/2019  . PAD (peripheral artery disease) ([place]) 11/22/2016  . Pain in limb 11/22/2016  . COPD (chronic obstructive pulmonary disease) ([place]) 11/22/2016  . Hyperlipidemia 11/22/2016  . Essential hypertension 11/22/2016    Immunization History  Administered Date(s) Administered  . Fluad Quad(high Dose 65+) 09/28/2020  . Influenza-Unspecified 09/17/2012, 09/01/2015, 09/27/2019  . Moderna Sars-Covid-2 Vaccination 01/18/2020, 02/15/2020  . Pneumococcal Conjugate-13 01/23/2021  . Pneumococcal-Unspecified 01/23/2004  . Tdap 11/18/2014, 02/09/2016    Conditions to be addressed/monitored:  Hyperlipidemia, COPD and breast CA  Care Plan : Medication Management  Updates made by De Hollingshead, RPH-CPP since 02/06/2021 12:00 AM    Problem: Breast cancer, COPD, HLD     [place] Goal: Disease Progression Prevention   Start Date: 12/26/2020  Recent Progress: On track  Priority: High  Note:   Current Barriers:  . Unable to independently afford treatment regimen . Complex patient with multiple comorbidities including recent breast cancer diagnosis, osteoporosis, COPD, that place at high risk of disease progression  Pharmacist Clinical Goal(s):  Marland Kitchen Over the next 90 days, patient will achieve adherence to monitoring guidelines and medication adherence to achieve therapeutic efficacy through collaboration with PharmD and provider.   Interventions: . 1:1 collaboration with McLean-Scocuzza, Nino Glow, MD regarding development and update of comprehensive plan of care as evidenced by provider attestation and co-signature . Inter-disciplinary care team collaboration (see longitudinal plan of care) . Comprehensive medication review performed; medication list  updated in electronic medical record  Health Maintenance: . Recently set up a call blocker to try to prevent scam calls. Has been blocking our office phone. She is trying to get this blocker turned off.   Ductal Carcinoma in Situ, R breast: . Management plan determination in process; Follows w/ Dr. Grayland Ormond, placed on tamoxifen 60 mg daily, plan for 5 years of tx . Encouraged continued collaboration with multidiscplinary team.  Chronic Obstructive Pulmonary Disease: Marland Kitchen Moderately well managed; current treatment: Breztri 160/9/4.5 mcg 2 puffs BID - reported nausea to Dr. Raul Del, so he provided with a sample of Trelegy instead. She has taken 13 of those 14 days. Denies nausea. Wonders what to do next.  Marland Kitchen GOLD Classification: likely D given symptom burden . Discussed that she could restart Breztri to see if nausea resurfaces or contact Dr. Gust Brooms office for a prescription for Trelegy. Reviewed that she would need to spend a total of $600 on prescription copays for 2022 prior to being approved for Trelegy assistance. She has decided to restart Breztri and if nausea resurfaces, she will contact Dr. Raul Del for a prescription for Trelegy to restart Trelegy.   Hypertension: . Controlled; current treatment: lisinopril/HCTZ 10/12.5 mg QAM, amlodipine 5 mg daily; follows w/ Dr. Humphrey Rolls . Previously recommended to continue current regimen with collaboration w/ cardiology and primary care, as well as importance of home monitoring.   Hyperlipidemia: . Uncontrolled, but more relaxed goal likely appropriate given age, comorbidities, and anticipated benefit; current treatment: ezetimibe 10 mg daily   . Medications previously tried: atorvastatin, lovastatin d/t itching, rash, GI tolerability . Previously encouraged to continue current regimen at this time.  Osteoporosis: . Appropriately managed; current regimen: Prolia Q34months; calcium + Vitamin D 3 daily.  . Previously reviewed importance of Prolia therapy  given impact of tamoxifen on bone density. Encouraged to continue current regimen at this time.  Chronic diarrhea/nausea/constipation: . Hx gall bladder removal; patient notes previous appts w/ Dr. Vicente Males. Notes improvement in diarrhea w/ daily cholestyramine packets. Occasional use of ondansetron for nausea. Has been intermittent since at least September . Encouraged continued follow up with gastroenterology and communication w/ GI and PCP regarding symptoms. Encouraged communication if any change in appetite.   Patient Goals/Self-Care Activities . Over the next 90 days, patient will:  - take medications as prescribed check blood pressure periodically, document, and provide at future appointments collaborate with provider on medication access solutions  Follow Up Plan: Telephone follow up appointment with care management team member scheduled for: ~2 weeks as previously scheduled      Medication Assistance: Breztri obtained through [facility] medication assistance program.  Enrollment ends 12/15/21 - though trialing Trelegy at this moment  Patient's preferred pharmacy is:  [place], [place] - [address] [place] [address] Phone: [phone] Fax: [phone]   Care Plan and Follow Up Patient Decision:  Patient agrees to Care Plan and Follow-up.  Plan: Telephone follow up appointment with care management team member scheduled for:  ~ 3 weeks as previously scheduled  Catie Darnelle Maffucci, PharmD, [place], [place] Clinical Pharmacist [employer] [facility] [employer] [phone]

## 2021-02-06 NOTE — Telephone Encounter (Signed)
Unable to connect with patient for AWV. Phone number on file is not accepting calls. Appointment removed from schedule.

## 2021-02-06 NOTE — Telephone Encounter (Signed)
Patient was rescheduled by front staff to 02/13/21.

## 2021-02-06 NOTE — Patient Instructions (Signed)
Visit Information  PATIENT GOALS: Goals Addressed              This Visit's Progress     Patient Stated   .  Medication Monitoring (pt-stated)        Patient Goals/Self-Care Activities . Over the next 90 days, patient will:  - take medications as prescribed check blood pressure periodically, document, and provide at future appointments collaborate with provider on medication access solutions         Patient verbalizes understanding of instructions provided today and agrees to view in [place].    Plan: Telephone follow up appointment with care management team member scheduled for:  ~ 3 weeks as previously scheduled  Catie Darnelle Maffucci, PharmD, [place], [place] Clinical Pharmacist [employer] [facility] [employer] [phone]

## 2021-02-06 NOTE — Telephone Encounter (Signed)
Patient just called me about inhalers. I reviewed that she missed AWV call. Encouraged her to call the office directly to be transferred to [facility]. She is doing this now.

## 2021-02-13 ENCOUNTER — Telehealth: Payer: Self-pay

## 2021-02-13 ENCOUNTER — Ambulatory Visit: Payer: Medicare Other

## 2021-02-13 NOTE — Telephone Encounter (Signed)
Called patient x3 for scheduled awv. Notes phone is screened by smart call blocker and is not accepting calls from this number. Patient removed from schedule.

## 2021-02-20 ENCOUNTER — Ambulatory Visit (INDEPENDENT_AMBULATORY_CARE_PROVIDER_SITE_OTHER): Payer: Medicare Other | Admitting: Pharmacist

## 2021-02-20 DIAGNOSIS — D0511 Intraductal carcinoma in situ of right breast: Secondary | ICD-10-CM | POA: Diagnosis not present

## 2021-02-20 DIAGNOSIS — J449 Chronic obstructive pulmonary disease, unspecified: Secondary | ICD-10-CM

## 2021-02-20 DIAGNOSIS — N1831 Chronic kidney disease, stage 3a: Secondary | ICD-10-CM | POA: Diagnosis not present

## 2021-02-20 DIAGNOSIS — E785 Hyperlipidemia, unspecified: Secondary | ICD-10-CM | POA: Diagnosis not present

## 2021-02-20 DIAGNOSIS — I739 Peripheral vascular disease, unspecified: Secondary | ICD-10-CM

## 2021-02-20 NOTE — Chronic Care Management (AMB) (Signed)
Chronic Care Management Pharmacy Note  02/20/2021 Name:  Kristen Bailey MRN:  [id number] DOB:  [birth year]  Subjective: Kristen Bailey is an 87 y.o. year old female who is a primary patient of McLean-Scocuzza, Nino Glow, MD.  The CCM team was consulted for assistance with disease management and care coordination needs.    Engaged with patient by telephone for follow up visit in response to provider referral for pharmacy case management and/or care coordination services.   Consent to Services:  The patient was given information about Chronic Care Management services, agreed to services, and gave verbal consent prior to initiation of services.  Please see initial visit note for detailed documentation.   Patient Care Team: McLean-Scocuzza, Nino Glow, MD as PCP - General (Internal Medicine) De Hollingshead, RPH-CPP (Pharmacist) Theodore Demark, RN as Oncology Nurse Navigator Lloyd Huger, MD as Consulting Physician (Oncology)  Recent office visits: None since our last call  Recent consult visits: None since our last call  Hospital visits: None in previous 6 months  Objective:  Lab Results  Component Value Date   CREATININE 0.93 01/23/2021   BUN 27 (H) 01/23/2021   GFR 56.18 (L) 01/23/2021   GFRNONAA >60 05/05/2019   GFRAA >60 05/05/2019   NA 140 01/23/2021   K 3.7 01/23/2021   CALCIUM 9.0 01/23/2021   CO2 34 (H) 01/23/2021    Lab Results  Component Value Date/Time   HGBA1C 5.6 08/17/2019 09:02 AM   GFR 56.18 (L) 01/23/2021 09:25 AM   GFR 57.59 (L) 09/28/2020 09:13 AM    Last diabetic Eye exam: No results found for: HMDIABEYEEXA  Last diabetic Foot exam: No results found for: HMDIABFOOTEX   Lab Results  Component Value Date   CHOL 215 (H) 01/23/2021   HDL 90.20 01/23/2021   LDLCALC 105 (H) 01/23/2021   TRIG 97.0 01/23/2021   CHOLHDL 2 01/23/2021    Hepatic Function Latest Ref Rng & Units 01/23/2021 09/28/2020 02/23/2020  Total Protein 6.0 -  8.3 g/dL 6.3 6.4 6.6  Albumin 3.5 - 5.2 g/dL 4.0 4.2 4.0  AST 0 - 37 U/L _0 ALT 0 - 35 U/L _1 Alk Phosphatase 39 - 117 U/L 32(L) 53 52  Total Bilirubin 0.2 - 1.2 mg/dL 1.1 1.2 1.0    Lab Results  Component Value Date/Time   TSH 2.11 09/28/2020 09:13 AM   TSH 1.21 08/17/2019 09:02 AM    CBC Latest Ref Rng & Units 01/23/2021 09/28/2020 02/23/2020  WBC 4.0 - 10.5 K/uL 6.1 5.6 5.9  Hemoglobin 12.0 - 15.0 g/dL 13.1 13.2 12.6  Hematocrit 36.0 - 46.0 % 40.2 39.8 37.8  Platelets 150.0 - 400.0 K/uL 237.0 269.0 292.0    Lab Results  Component Value Date/Time   VD25OH 46.86 08/17/2019 09:02 AM    Clinical ASCVD: No    Depression screen [facility] 2/9 04/11/2020 03/22/2020 02/04/2020  Decreased Interest 0 0 0  Down, Depressed, Hopeless 0 0 0  PHQ - 2 Score 0 0 0    Social History   Tobacco Use  Smoking Status Former Smoker  Smokeless Tobacco Never Used   BP Readings from Last 3 Encounters:  01/09/21 (!) 194/78  10/26/20 116/66  10/20/20 134/70   Pulse Readings from Last 3 Encounters:  01/09/21 92  10/26/20 91  10/20/20 (!) 113   Wt Readings from Last 3 Encounters:  01/09/21 131 lb 9.6 oz (59.7 kg)  10/26/20 133 lb 6.4 oz (60.5 kg)  10/20/20 135 lb 8 oz (61.5 kg)    Assessment/Interventions: Review of patient past medical history, allergies, medications, health status, including review of consultants reports, laboratory and other test data, was performed as part of comprehensive evaluation and provision of chronic care management services.   SDOH:  (Social Determinants of Health) assessments and interventions performed: Yes SDOH Interventions   Flowsheet Row Most Recent Value  SDOH Interventions   Financial Strain Interventions Other (Comment)  [manufacturer assistance]      CCM Care Plan  Allergies  Allergen Reactions  . Trelegy Ellipta [Fluticasone-Umeclidin-Vilant]     Shortness of breath  . Codeine Nausea And Vomiting  . Lipitor [Atorvastatin] Nausea And  Vomiting  . Omeprazole     unknown  . Vicodin [Hydrocodone-Acetaminophen]     unknown  . Zyban [Bupropion]     unknown  . Mevacor [Lovastatin] Itching and Rash    Medications Reviewed Today    Reviewed by De Hollingshead, RPH-CPP (Pharmacist) on 02/20/21 at [phone]  Med List Status: <None>  Medication Order Taking? Sig Documenting Provider Last Dose Status Informant  acetaminophen (TYLENOL) 500 MG tablet [id number] Yes Take 500 mg by mouth every 6 (six) hours as needed. [provider] Taking Active Self  albuterol (VENTOLIN HFA) 108 (90 Base) MCG/ACT inhaler [id number] Yes Inhale 1-2 puffs into the lungs every 6 (six) hours as needed for wheezing or shortness of breath. [provider] Taking Active   amLODipine (NORVASC) 5 MG tablet [id number] Yes Take 1 tablet (5 mg total) by mouth daily. McLean-Scocuzza, Nino Glow, MD Taking Active   Budeson-Glycopyrrol-Formoterol (BREZTRI AEROSPHERE) 160-9-4.8 MCG/ACT AERO [id number] Yes Inhale 2 puffs into the lungs in the morning and at bedtime. [provider] Taking Active   calcium carbonate (OSCAL) 1500 (600 Ca) MG TABS tablet [id number] Yes Take 1,500 mg by mouth 2 (two) times daily with a meal.  [provider] Taking Active Self  Cholecalciferol 25 MCG (1000 UT) tablet [id number] Yes Take 1,000 Units by mouth daily.  [provider] Taking Active Self           Med Note Josiah Lobo, [facility]   Tue Jun 24, 2017 11:09 AM)    cholestyramine (QUESTRAN) 4 g packet [id number]  Take 1 packet (4 g total) by mouth daily before breakfast for 1 dose. As needed McLean-Scocuzza, Nino Glow, MD  Expired 09/29/20 2359   denosumab (PROLIA) 60 MG/ML SOSY injection [id number] Yes Inject 60 mg into the skin every 6 (six) months. McLean-Scocuzza, Nino Glow, MD Taking Active   ezetimibe (ZETIA) 10 MG tablet [id number] Yes Take 1 tablet (10 mg total) by mouth daily. McLean-Scocuzza, Nino Glow, MD Taking Active   fluticasone [facility]) 50  MCG/ACT nasal spray [id number] Yes Place into the nose daily as needed.  [provider] Taking Active   hydrOXYzine (ATARAX/VISTARIL) 25 MG tablet [id number] Yes Take 1 tablet (25 mg total) by mouth 2 (two) times daily as needed. Try at night first. Please deliver. Stop allegra McLean-Scocuzza, Nino Glow, MD Taking Active   levalbuterol Penne Lash) 0.63 MG/3ML nebulizer solution [id number]  Take 0.63 mg by nebulization every 4 (four) hours as needed for wheezing or shortness of breath. [provider]  Active   lisinopril-hydrochlorothiazide (ZESTORETIC) 10-12.5 MG tablet [id number] Yes Take 1 tablet by mouth daily. McLean-Scocuzza, Nino Glow, MD Taking Active   LORazepam (ATIVAN) 1 MG tablet [id number] Yes Take 1 mg by mouth 2 (two) times daily.  [provider] Taking Active Self  Med Note Darnelle Maffucci, Arville Lime   Fri Sep 29, 2020  2:38 PM) Taking some mornings, but every evening  Magnesium 400 MG TABS [id number] Yes Take by mouth daily. [provider] Taking Active   Omega-3 Fatty Acids (FISH OIL) 1000 MG CAPS [id number] Yes Take 1 capsule by mouth daily. [provider] Taking Active   ondansetron (ZOFRAN) 4 MG tablet [id number] Yes Take 1 tablet (4 mg total) by mouth every 8 (eight) hours as needed for nausea or vomiting. McLean-Scocuzza, Nino Glow, MD Taking Active   potassium chloride SA (KLOR-CON) 20 MEQ tablet [id number] Yes Take 1 tablet (20 mEq total) by mouth daily. McLean-Scocuzza, Nino Glow, MD Taking Active   tamoxifen (NOLVADEX) 20 MG tablet [id number] Yes Take 1 tablet (20 mg total) by mouth daily. Lloyd Huger, MD Taking Active           Patient Active Problem List   Diagnosis Date Noted  . Insomnia 10/30/2020  . History of right breast cancer 10/26/2020  . Supplemental oxygen dependent 10/10/2020  . 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 Osteoporosis 09/13/2020  . Hives 04/11/2020  . IBS  (irritable bowel syndrome) 08/04/2019  . Hemorrhoids 08/04/2019  . Anxiety 08/04/2019  . Leg cramps 08/04/2019  . Hypomagnesemia 08/04/2019  . Nausea 08/04/2019  . Hypokalemia 08/04/2019  . PAD (peripheral artery disease) ([place]) 11/22/2016  . Pain in limb 11/22/2016  . COPD (chronic obstructive pulmonary disease) ([place]) 11/22/2016  . Hyperlipidemia 11/22/2016  . Essential hypertension 11/22/2016    Immunization History  Administered Date(s) Administered  . Fluad Quad(high Dose 65+) 09/28/2020  . Influenza-Unspecified 09/17/2012, 09/01/2015, 09/27/2019  . Moderna Sars-Covid-2 Vaccination 01/18/2020, 02/15/2020  . Pneumococcal Conjugate-13 01/23/2021  . Pneumococcal-Unspecified 01/23/2004  . Tdap 11/18/2014, 02/09/2016    Conditions to be addressed/monitored:  Hyperlipidemia, COPD and hx breast cancer  Care Plan : Medication Management  Updates made by De Hollingshead, RPH-CPP since 02/20/2021 12:00 AM    Problem: Breast cancer, COPD, HLD     [place] Goal: Disease Progression Prevention   Start Date: 12/26/2020  Recent Progress: On track  Priority: High  Note:   Current Barriers:  . Unable to independently afford treatment regimen . Complex patient with multiple comorbidities including recent breast cancer diagnosis, osteoporosis, COPD, that place at high risk of disease progression  Pharmacist Clinical Goal(s):  Marland Kitchen Over the next 90 days, patient will achieve adherence to monitoring guidelines and medication adherence to achieve therapeutic efficacy through collaboration with PharmD and provider.   Interventions: . 1:1 collaboration with McLean-Scocuzza, Nino Glow, MD regarding development and update of comprehensive plan of care as evidenced by provider attestation and co-signature . Inter-disciplinary care team collaboration (see longitudinal plan of care) . Comprehensive medication review performed; medication list updated in electronic medical record  Health  Maintenance: . Recently set up a call blocker to try to prevent scam calls. Has been blocking our office phone. She tried to get the block turned off, but our office is still blocked today. Was able to get through on work cell.   Marland Kitchen Up to date on COVID vaccinations, but cannot find her card today to report booster dose day. Encouraged to bring to next PCP appointment for documentation  Ductal Carcinoma in Situ, R breast: . Managed; Follows w/ Dr. Grayland Ormond, placed on tamoxifen 60 mg daily, plan for 5 years of tx. Patient reports needing mammogram prior to appt w/ Dr. Grayland Ormond next month . Continues to  report occasional nausea, but it passes quickly and has never resulted in vomiting. Reports an episode last night that kept her awake. Took ondansetron and this helped. Relates the onset of occasional nausea to onset of tamoxifen therapy.  . Encouraged to discuss nausea w/ Dr. Grayland Ormond at next appointment. Encouraged to contact him sooner if nausea worsens or become more frequent.   . Reviewed next appt w/ Dr. Grayland Ormond  Chronic Obstructive Pulmonary Disease: Marland Kitchen Moderately well managed; current treatment: - using Trelegy 100/62.5/25 mcg daily. Raul Del had suggested trial of Trelegy as patient was at first wondering if nausea was related to [place]. However, patient reports that she has needed to take albuterol HFA rescue therapy more frequently with Trelegy, and nausea has persisted. She has decided to switch back to [facility] 160/9/4.5 mcg 2 puffs BID. Receiving from patient assistance . GOLD Classification: likely D given symptom burden . Agree with patient decision to switch back to [facility]. Continue this regimen at this time with collaboration with Dr. Raul Del. Reviewed principals of maintenance vs rescue inhaler therapy.  . Reviewed upcoming appt w/ Dr. Raul Del.   Hypertension: . Controlled; current treatment: lisinopril/HCTZ 10/12.5 mg QAM, amlodipine 5 mg daily; follows w/ Dr. Humphrey Rolls  . Home BP  readings: not checking at home . Denies any concerns w/ lightheadedness, dizziness, especially upon standing. Stands up slowly.  . Recommended to continue current regimen with collaboration w/ cardiology and primary care, as well as importance of occasional home monitoring.   Hyperlipidemia: . Uncontrolled, but more relaxed goal likely appropriate given age, comorbidities, and anticipated benefit; current treatment: ezetimibe 10 mg daily   . Medications previously tried: atorvastatin, lovastatin d/t itching, rash, GI tolerability . Recommended to continue current regimen at this time.   Osteoporosis: . Appropriately managed; current regimen: Prolia Q65month; calcium + Vitamin D 3 daily. Last dose 10/26/20, scheduled for next injection in May.  . Previously reviewed importance of Prolia therapy given impact of tamoxifen on bone density. Encouraged to continue current regimen at this time.  Chronic diarrhea/nausea/constipation: . Hx gall bladder removal; patient notes previous appts w/ Dr. AVicente Males Notes improvement in diarrhea w/ occasional use of cholestyramine packets.   . Encouraged continued follow up with gastroenterology. No f/u scheduled at this time  Patient Goals/Self-Care Activities . Over the next 90 days, patient will:  - take medications as prescribed check blood pressure periodically, document, and provide at future appointments collaborate with provider on medication access solutions  Follow Up Plan: Telephone follow up appointment with care management team member scheduled for: ~12 weeks       Medication Assistance: Breztri obtained through [employer] assistance program.  Enrollment ends 12/15/21  [facility] [address]Phone: [phone]: [phone] Care Plan and Follow Up Patient Decision:  Patient agrees to Care Plan and Follow-up.  Plan:  Telephone follow up appointment with care management team member scheduled for:  ~ 12 weeks.  Catie TDarnelle Maffucci PharmD, BR[place] CTabor CityClinical Pharmacist [place]

## 2021-02-20 NOTE — Patient Instructions (Signed)
It was great to talk to you today!  Please reach out with any questions or concerns!  Catie Darnelle Maffucci, PharmD, [place], CPP Clinical Pharmacist [place] at [facility] [phone]   Visit Information  PATIENT GOALS: Goals Addressed              This Visit's Progress     Patient Stated   .  Medication Monitoring (pt-stated)        Patient Goals/Self-Care Activities . Over the next 90 days, patient will:  - take medications as prescribed check blood pressure periodically, document, and provide at future appointments collaborate with provider on medication access solutions         The patient verbalized understanding of instructions, educational materials, and care plan provided today and declined offer to receive copy of patient instructions, educational materials, and care plan.   Plan: Telephone follow up appointment with care management team member scheduled for:  ~ 12 weeks.  Catie Darnelle Maffucci, PharmD, [place], [place] Clinical Pharmacist [employer] at [place]

## 2021-02-21 ENCOUNTER — Telehealth: Payer: Self-pay

## 2021-02-21 ENCOUNTER — Ambulatory Visit: Payer: Medicare Other

## 2021-02-21 DIAGNOSIS — J449 Chronic obstructive pulmonary disease, unspecified: Secondary | ICD-10-CM | POA: Diagnosis not present

## 2021-02-21 NOTE — Telephone Encounter (Signed)
Preferred phone number is not accepting calls from this network. Unable to leave a message. Reschedule as appropriate.

## 2021-02-22 ENCOUNTER — Ambulatory Visit: Payer: Self-pay | Admitting: Pharmacist

## 2021-02-22 DIAGNOSIS — I739 Peripheral vascular disease, unspecified: Secondary | ICD-10-CM

## 2021-02-22 DIAGNOSIS — E785 Hyperlipidemia, unspecified: Secondary | ICD-10-CM

## 2021-02-22 DIAGNOSIS — J449 Chronic obstructive pulmonary disease, unspecified: Secondary | ICD-10-CM

## 2021-02-22 NOTE — Patient Instructions (Signed)
Visit Information  PATIENT GOALS: Goals Addressed              This Visit's Progress     Patient Stated   .  Medication Monitoring (pt-stated)        Patient Goals/Self-Care Activities . Over the next 90 days, patient will:  - take medications as prescribed check blood pressure periodically, document, and provide at future appointments collaborate with provider on medication access solutions          Patient verbalizes understanding of instructions provided today and agrees to view in [place].   Plan: Telephone follow up appointment with care management team member scheduled for:  ~ 10 weeks as previously scheduled  Catie Darnelle Maffucci, PharmD, [place], [place] Clinical Pharmacist [employer] at [employer] [phone]

## 2021-02-22 NOTE — Progress Notes (Signed)
Chronic Care Management Pharmacy Note  02/22/2021 Name:  Arista Kettlewell MRN:  [id number] DOB:  March 08, 1933  Subjective: Debbra Riding Godar is an 87 y.o. year old female who is a primary patient of McLean-Scocuzza, Nino Glow, MD.  The CCM team was consulted for assistance with disease management and care coordination needs.    Engaged with patient by telephone for reponse to call regarding health maintenance in response to provider referral for pharmacy case management and/or care coordination services.   Consent to Services:  The patient was given information about Chronic Care Management services, agreed to services, and gave verbal consent prior to initiation of services.  Please see initial visit note for detailed documentation.   Patient Care Team: McLean-Scocuzza, Nino Glow, MD as PCP - General (Internal Medicine) De Hollingshead, RPH-CPP (Pharmacist) Theodore Demark, RN as Oncology Nurse Navigator Lloyd Huger, MD as Consulting Physician (Oncology)  Recent office visits: None since our last call  Recent consult visits: None since our last call  Hospital visits: None in previous 6 months  Objective:  Lab Results  Component Value Date   CREATININE 0.93 01/23/2021   BUN 27 (H) 01/23/2021   GFR 56.18 (L) 01/23/2021   GFRNONAA >60 05/05/2019   GFRAA >60 05/05/2019   NA 140 01/23/2021   K 3.7 01/23/2021   CALCIUM 9.0 01/23/2021   CO2 34 (H) 01/23/2021    Lab Results  Component Value Date/Time   HGBA1C 5.6 08/17/2019 09:02 AM   GFR 56.18 (L) 01/23/2021 09:25 AM   GFR 57.59 (L) 09/28/2020 09:13 AM    Last diabetic Eye exam: No results found for: HMDIABEYEEXA  Last diabetic Foot exam: No results found for: HMDIABFOOTEX   Lab Results  Component Value Date   CHOL 215 (H) 01/23/2021   HDL 90.20 01/23/2021   LDLCALC 105 (H) 01/23/2021   TRIG 97.0 01/23/2021   CHOLHDL 2 01/23/2021    Hepatic Function Latest Ref Rng & Units 01/23/2021 09/28/2020  02/23/2020  Total Protein 6.0 - 8.3 g/dL 6.3 6.4 6.6  Albumin 3.5 - 5.2 g/dL 4.0 4.2 4.0  AST 0 - 37 U/L _0 ALT 0 - 35 U/L _1 Alk Phosphatase 39 - 117 U/L 32(L) 53 52  Total Bilirubin 0.2 - 1.2 mg/dL 1.1 1.2 1.0    Lab Results  Component Value Date/Time   TSH 2.11 09/28/2020 09:13 AM   TSH 1.21 08/17/2019 09:02 AM    CBC Latest Ref Rng & Units 01/23/2021 09/28/2020 02/23/2020  WBC 4.0 - 10.5 K/uL 6.1 5.6 5.9  Hemoglobin 12.0 - 15.0 g/dL 13.1 13.2 12.6  Hematocrit 36.0 - 46.0 % 40.2 39.8 37.8  Platelets 150.0 - 400.0 K/uL 237.0 269.0 292.0    Lab Results  Component Value Date/Time   VD25OH 46.86 08/17/2019 09:02 AM    Clinical ASCVD: No    Depression screen [facility] 2/9 04/11/2020 03/22/2020 02/04/2020  Decreased Interest 0 0 0  Down, Depressed, Hopeless 0 0 0  PHQ - 2 Score 0 0 0     Social History   Tobacco Use  Smoking Status Former Smoker  Smokeless Tobacco Never Used   BP Readings from Last 3 Encounters:  01/09/21 (!) 194/78  10/26/20 116/66  10/20/20 134/70   Pulse Readings from Last 3 Encounters:  01/09/21 92  10/26/20 91  10/20/20 (!) 113   Wt Readings from Last 3 Encounters:  01/09/21 131 lb 9.6 oz (59.7 kg)  10/26/20 133 lb  6.4 oz (60.5 kg)  10/20/20 135 lb 8 oz (61.5 kg)    Assessment/Interventions: Review of patient past medical history, allergies, medications, health status, including review of consultants reports, laboratory and other test data, was performed as part of comprehensive evaluation and provision of chronic care management services.   SDOH:  (Social Determinants of Health) assessments and interventions performed: Yes SDOH Interventions   Flowsheet Row Most Recent Value  SDOH Interventions   Financial Strain Interventions Other (Comment)  [manufacturer assistance]      CCM Care Plan  Allergies  Allergen Reactions  . Trelegy Ellipta [Fluticasone-Umeclidin-Vilant]     Shortness of breath  . Codeine Nausea And Vomiting  .  Lipitor [Atorvastatin] Nausea And Vomiting  . Omeprazole     unknown  . Vicodin [Hydrocodone-Acetaminophen]     unknown  . Zyban [Bupropion]     unknown  . Mevacor [Lovastatin] Itching and Rash    Medications Reviewed Today    Reviewed by De Hollingshead, RPH-CPP (Pharmacist) on 02/20/21 at [phone]  Med List Status: <None>  Medication Order Taking? Sig Documenting Provider Last Dose Status Informant  acetaminophen (TYLENOL) 500 MG tablet [id number] Yes Take 500 mg by mouth every 6 (six) hours as needed. [provider] Taking Active Self  albuterol (VENTOLIN HFA) 108 (90 Base) MCG/ACT inhaler [id number] Yes Inhale 1-2 puffs into the lungs every 6 (six) hours as needed for wheezing or shortness of breath. [provider] Taking Active   amLODipine (NORVASC) 5 MG tablet [id number] Yes Take 1 tablet (5 mg total) by mouth daily. McLean-Scocuzza, Nino Glow, MD Taking Active   Budeson-Glycopyrrol-Formoterol (BREZTRI AEROSPHERE) 160-9-4.8 MCG/ACT AERO [id number] Yes Inhale 2 puffs into the lungs in the morning and at bedtime. [provider] Taking Active   calcium carbonate (OSCAL) 1500 (600 Ca) MG TABS tablet [id number] Yes Take 1,500 mg by mouth 2 (two) times daily with a meal.  [provider] Taking Active Self  Cholecalciferol 25 MCG (1000 UT) tablet [id number] Yes Take 1,000 Units by mouth daily.  [provider] Taking Active Self           Med Note Josiah Lobo, [facility]   Tue Jun 24, 2017 11:09 AM)    cholestyramine (QUESTRAN) 4 g packet [id number]  Take 1 packet (4 g total) by mouth daily before breakfast for 1 dose. As needed McLean-Scocuzza, Nino Glow, MD  Expired 09/29/20 2359   denosumab (PROLIA) 60 MG/ML SOSY injection [id number] Yes Inject 60 mg into the skin every 6 (six) months. McLean-Scocuzza, Nino Glow, MD Taking Active   ezetimibe (ZETIA) 10 MG tablet [id number] Yes Take 1 tablet (10 mg total) by mouth daily. McLean-Scocuzza, Nino Glow, MD Taking  Active   fluticasone [facility]) 50 MCG/ACT nasal spray [id number] Yes Place into the nose daily as needed.  [provider] Taking Active   hydrOXYzine (ATARAX/VISTARIL) 25 MG tablet [id number] Yes Take 1 tablet (25 mg total) by mouth 2 (two) times daily as needed. Try at night first. Please deliver. Stop allegra McLean-Scocuzza, Nino Glow, MD Taking Active   levalbuterol Penne Lash) 0.63 MG/3ML nebulizer solution [id number]  Take 0.63 mg by nebulization every 4 (four) hours as needed for wheezing or shortness of breath. [provider]  Active   lisinopril-hydrochlorothiazide (ZESTORETIC) 10-12.5 MG tablet [id number] Yes Take 1 tablet by mouth daily. McLean-Scocuzza, Nino Glow, MD Taking Active   LORazepam (ATIVAN) 1 MG tablet [id number] Yes Take 1 mg by mouth 2 (two) times daily.  [provider] Taking  Active Self           Med Note Darnelle Maffucci, Arville Lime   Fri Sep 29, 2020  2:38 PM) Taking some mornings, but every evening  Magnesium 400 MG TABS [id number] Yes Take by mouth daily. [provider] Taking Active   Omega-3 Fatty Acids (FISH OIL) 1000 MG CAPS [id number] Yes Take 1 capsule by mouth daily. [provider] Taking Active   ondansetron (ZOFRAN) 4 MG tablet [id number] Yes Take 1 tablet (4 mg total) by mouth every 8 (eight) hours as needed for nausea or vomiting. McLean-Scocuzza, Nino Glow, MD Taking Active   potassium chloride SA (KLOR-CON) 20 MEQ tablet [id number] Yes Take 1 tablet (20 mEq total) by mouth daily. McLean-Scocuzza, Nino Glow, MD Taking Active   tamoxifen (NOLVADEX) 20 MG tablet [id number] Yes Take 1 tablet (20 mg total) by mouth daily. Lloyd Huger, MD Taking Active           Patient Active Problem List   Diagnosis Date Noted  . Insomnia 10/30/2020  . History of right breast cancer 10/26/2020  . Supplemental oxygen dependent 10/10/2020  . 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 Osteoporosis  09/13/2020  . Hives 04/11/2020  . IBS (irritable bowel syndrome) 08/04/2019  . Hemorrhoids 08/04/2019  . Anxiety 08/04/2019  . Leg cramps 08/04/2019  . Hypomagnesemia 08/04/2019  . Nausea 08/04/2019  . Hypokalemia 08/04/2019  . PAD (peripheral artery disease) ([place]) 11/22/2016  . Pain in limb 11/22/2016  . COPD (chronic obstructive pulmonary disease) ([place]) 11/22/2016  . Hyperlipidemia 11/22/2016  . Essential hypertension 11/22/2016    Immunization History  Administered Date(s) Administered  . Fluad Quad(high Dose 65+) 09/28/2020  . Influenza-Unspecified 09/17/2012, 09/01/2015, 09/27/2019  . Moderna Sars-Covid-2 Vaccination 01/18/2020, 02/15/2020, 12/10/2020  . Pneumococcal Conjugate-13 01/23/2021  . Pneumococcal-Unspecified 01/23/2004  . Tdap 11/18/2014, 02/09/2016    Conditions to be addressed/monitored:  Hyperlipidemia and COPD  Care Plan : Medication Management  Updates made by De Hollingshead, RPH-CPP since 02/22/2021 12:00 AM    Problem: Breast cancer, COPD, HLD     [place] Goal: Disease Progression Prevention   Start Date: 12/26/2020  Recent Progress: On track  Priority: High  Note:   Current Barriers:  . Unable to independently afford treatment regimen . Complex patient with multiple comorbidities including recent breast cancer diagnosis, osteoporosis, COPD, that place at high risk of disease progression  Pharmacist Clinical Goal(s):  Marland Kitchen Over the next 90 days, patient will achieve adherence to monitoring guidelines and medication adherence to achieve therapeutic efficacy through collaboration with PharmD and provider.   Interventions: . 1:1 collaboration with McLean-Scocuzza, Nino Glow, MD regarding development and update of comprehensive plan of care as evidenced by provider attestation and co-signature . Inter-disciplinary care team collaboration (see longitudinal plan of care) . Comprehensive medication review performed; medication list updated in  electronic medical record  Health Maintenance: . Called today to report the date of her COVID booster. Added to chart.   Ductal Carcinoma in Situ, R breast: . Managed; Follows w/ Dr. Grayland Ormond, placed on tamoxifen 60 mg daily, plan for 5 years of tx. Patient reports needing mammogram prior to appt w/ Dr. Grayland Ormond next month . Previously recommended to continue current regimen at this time and collaboration with oncology   Chronic Obstructive Pulmonary Disease: Marland Kitchen Moderately well managed; current treatment: Breztri 160/9/4.5 mcg 2 puffs BID. Receiving from patient assistance . GOLD Classification: likely D given symptom burden . Previously recommended to continue  current regimen at this time along with collaboration with pulmonary  Hypertension: . Controlled; current treatment: lisinopril/HCTZ 10/12.5 mg QAM, amlodipine 5 mg daily; follows w/ Dr. Humphrey Rolls  . Previously recommended to continue current regimen with collaboration w/ cardiology and primary care  Hyperlipidemia: . Uncontrolled, but more relaxed goal likely appropriate given age, comorbidities, and anticipated benefit; current treatment: ezetimibe 10 mg daily   . Medications previously tried: atorvastatin, lovastatin d/t itching, rash, GI tolerability . Previously recommended to continue current regimen at this time.   Osteoporosis: . Appropriately managed; current regimen: Prolia Q19month; calcium + Vitamin D 3 daily. Last dose 10/26/20, scheduled for next injection in May.  . Previously reviewed importance of Prolia therapy given impact of tamoxifen on bone density. Encouraged to continue current regimen at this time.  Chronic diarrhea/nausea/constipation: . Hx gall bladder removal; patient notes previous appts w/ Dr. AVicente Males Notes improvement in diarrhea w/ occasional use of cholestyramine packets.   . Previously recommended to continue follow up with gastroenterology. No f/u scheduled at this time  Patient Goals/Self-Care  Activities . Over the next 90 days, patient will:  - take medications as prescribed check blood pressure periodically, document, and provide at future appointments collaborate with provider on medication access solutions  Follow Up Plan: Telephone follow up appointment with care management team member scheduled for: ~10 weeks as previously scheduled        Medication Assistance: Breztri [facility].  Enrollment ends 12/15/21  [facility] [address]Phone: [phone]: [phone]  Care Plan and Follow Up Patient Decision:  Patient agrees to Care Plan and Follow-up.  Plan: Telephone follow up appointment with care management team member scheduled for:  ~ 10 weeks as previously scheduled  Catie TDarnelle Maffucci PharmD, BJet CBreckenridgeClinical Pharmacist LOccidental Petroleumat BJohnson & [phone]

## 2021-03-07 ENCOUNTER — Ambulatory Visit: Payer: Medicare Other | Admitting: Internal Medicine

## 2021-03-09 ENCOUNTER — Telehealth: Payer: Self-pay | Admitting: Internal Medicine

## 2021-03-09 ENCOUNTER — Ambulatory Visit: Payer: Self-pay | Admitting: Internal Medicine

## 2021-03-09 NOTE — Telephone Encounter (Signed)
Patient no-showed today's appointment; appointment was for 03/09/21 at 2:00, provider notified for review of record. Letter sent for patient to call in and re-schedule.

## 2021-03-12 DIAGNOSIS — J439 Emphysema, unspecified: Secondary | ICD-10-CM | POA: Diagnosis not present

## 2021-03-12 DIAGNOSIS — R0600 Dyspnea, unspecified: Secondary | ICD-10-CM | POA: Diagnosis not present

## 2021-03-12 DIAGNOSIS — Z9981 Dependence on supplemental oxygen: Secondary | ICD-10-CM | POA: Diagnosis not present

## 2021-03-13 ENCOUNTER — Encounter: Payer: Self-pay | Admitting: Internal Medicine

## 2021-03-13 ENCOUNTER — Other Ambulatory Visit: Payer: Self-pay

## 2021-03-13 ENCOUNTER — Ambulatory Visit (INDEPENDENT_AMBULATORY_CARE_PROVIDER_SITE_OTHER): Payer: Medicare Other | Admitting: Internal Medicine

## 2021-03-13 VITALS — BP 108/66 | HR 95 | Ht 63.0 in | Wt 131.0 lb

## 2021-03-13 DIAGNOSIS — Z853 Personal history of malignant neoplasm of breast: Secondary | ICD-10-CM

## 2021-03-13 DIAGNOSIS — D0511 Intraductal carcinoma in situ of right breast: Secondary | ICD-10-CM

## 2021-03-13 DIAGNOSIS — L659 Nonscarring hair loss, unspecified: Secondary | ICD-10-CM

## 2021-03-13 DIAGNOSIS — I10 Essential (primary) hypertension: Secondary | ICD-10-CM | POA: Diagnosis not present

## 2021-03-13 DIAGNOSIS — E785 Hyperlipidemia, unspecified: Secondary | ICD-10-CM | POA: Diagnosis not present

## 2021-03-13 DIAGNOSIS — E876 Hypokalemia: Secondary | ICD-10-CM | POA: Diagnosis not present

## 2021-03-13 DIAGNOSIS — J449 Chronic obstructive pulmonary disease, unspecified: Secondary | ICD-10-CM

## 2021-03-13 DIAGNOSIS — R110 Nausea: Secondary | ICD-10-CM | POA: Diagnosis not present

## 2021-03-13 MED ORDER — EZETIMIBE 10 MG PO TABS: 10.0000 mg | ORAL_TABLET | Freq: Every day | ORAL | 3 refills | Status: AC

## 2021-03-13 MED ORDER — POTASSIUM CHLORIDE CRYS ER 20 MEQ PO TBCR
20.0000 meq | EXTENDED_RELEASE_TABLET | Freq: Every day | ORAL | 3 refills | Status: AC
Start: 2021-03-13 — End: 2022-07-08

## 2021-03-13 MED ORDER — EZETIMIBE 10 MG PO TABS: 10.0000 mg | ORAL_TABLET | Freq: Every day | ORAL | 3 refills | Status: DC

## 2021-03-13 MED ORDER — ONDANSETRON HCL 4 MG PO TABS: 4.0000 mg | ORAL_TABLET | Freq: Three times a day (TID) | ORAL | 3 refills | Status: AC | PRN

## 2021-03-13 NOTE — Patient Instructions (Addendum)
Consider CT chest/abdomen/pelvis in the future let me know if you want to do this for staging of breast cancer Discuss with Dr. Grayland Kristen Bailey is Tamoxifen causing nausea would other oral cancer medications be considered if so  OTC arnica containing moisturizer for bruising  cerave cream for dry cream  Amlactin lotion for for rough spots

## 2021-03-13 NOTE — Addendum Note (Signed)
Addended by: Orland Mustard on: 03/13/2021 04:29 PM   Modules accepted: Orders

## 2021-03-13 NOTE — Progress Notes (Signed)
Chief Complaint  Patient presents with  . Follow-up   F/u  1. Right breast cancer on tamoxifen c/o nausea reviewed 5-26 % appt Dr. Grayland Ormond upcoming 04/26/21 and mammogram 04/24/21 prolia due 04/25/21 in our clinic  2. Ab nausea/bloating denies diarrhea agreeable to stop [place] she is doing dairy with cereal and we disc cutting this back she had not had nausea in 3 weeks until today so took prn zofran  3. HTN controlled on norvasc 5 mg qd/lis-hctz 10-12.5 mg qd/HLD on zetia 10 mg qd  4. Copd severe on 2-3L max albuterol breztri could not trelegy  5. C/o right temple hair line hair loss f/u dermatology 06/2021    Review of Systems  Constitutional: Negative for weight loss.  HENT: Negative for hearing loss.   Eyes: Negative for blurred vision.  Respiratory: Positive for shortness of breath.   Cardiovascular: Negative for chest pain.  Gastrointestinal: Negative for abdominal pain.  Musculoskeletal: Negative for falls and joint pain.  Skin: Negative for rash.       +hair loss  Neurological: Negative for headaches.  Psychiatric/Behavioral: Negative for depression.   Past Medical History:  Diagnosis Date  . Anxiety   . Chicken pox   . COPD (chronic obstructive pulmonary disease) ([place])   . Essential tremor   . Hypertension   . Hypomagnesemia   . Irritable bowel syndrome (IBS)   . 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 ABDOMINAL HYSTERECTOMY    . APPENDECTOMY    . BREAST BIOPSY Right 10/05/2020   stereo bx, x-clip, path pending   . CHOLECYSTECTOMY    . OVARIAN CYST REMOVAL    . SMALL INTESTINE SURGERY     Family History  Problem Relation Age of Onset  . Heart attack Mother   . Heart disease Mother   . Hypertension Mother   . Cancer Father        bladder smoker  . Heart disease Brother        MI in 81s   . Breast cancer Neg Hx    Social  History   Socioeconomic History  . Marital status: Widowed    Spouse name: Not on file  . Number of children: Not on file  . Years of education: Not on file  . Highest education level: Not on file  Occupational History  . Not on file  Tobacco Use  . Smoking status: Former Research scientist (life sciences)  . Smokeless tobacco: Never Used  Substance and Sexual Activity  . Alcohol use: No  . Drug use: No  . Sexual activity: Not Currently  Other Topics Concern  . Not on file  Social History Narrative   Has 4 children 3 girls and 1 boy   Former smoker    Lives in in [place] with white [employer]         Social Determinants of Health   Financial Resource Strain: Low Risk   . Difficulty of Paying Living Expenses: Not very hard  Food Insecurity: Not on file  Transportation Needs: Not on file  Physical Activity: Not on file  Stress: Not on file  Social Connections: Not on file  Intimate Partner Violence: Not on file   Current Meds  Medication Sig  . acetaminophen (TYLENOL) 500 MG tablet Take 500 mg by mouth every 6 (six) hours as needed.  Marland Kitchen  albuterol (VENTOLIN HFA) 108 (90 Base) MCG/ACT inhaler Inhale 1-2 puffs into the lungs every 6 (six) hours as needed for wheezing or shortness of breath.  Marland Kitchen amLODipine (NORVASC) 5 MG tablet Take 1 tablet (5 mg total) by mouth daily.  . Budeson-Glycopyrrol-Formoterol (BREZTRI AEROSPHERE) 160-9-4.8 MCG/ACT AERO Inhale 2 puffs into the lungs in the morning and at bedtime.  . calcium carbonate (OSCAL) 1500 (600 Ca) MG TABS tablet Take 1,500 mg by mouth 2 (two) times daily with a meal.   . Cholecalciferol 25 MCG (1000 UT) tablet Take 1,000 Units by mouth daily.   . fluticasone (FLONASE) 50 MCG/ACT nasal spray Place into the nose daily as needed.   . levalbuterol (XOPENEX) 0.63 MG/3ML nebulizer solution Take 0.63 mg by nebulization every 4 (four) hours as needed for wheezing or shortness of breath.  . lisinopril-hydrochlorothiazide (ZESTORETIC) 10-12.5  MG tablet Take 1 tablet by mouth daily.  Marland Kitchen LORazepam (ATIVAN) 1 MG tablet Take 1 mg by mouth 2 (two) times daily.   . Magnesium 400 MG TABS Take by mouth daily.  . Omega-3 Fatty Acids (FISH OIL) 1000 MG CAPS Take 1 capsule by mouth daily.  . tamoxifen (NOLVADEX) 20 MG tablet Take 1 tablet (20 mg total) by mouth daily.  . [DISCONTINUED] cholestyramine (QUESTRAN) 4 g packet Take 1 packet (4 g total) by mouth daily before breakfast for 1 dose. As needed  . [DISCONTINUED] ezetimibe (ZETIA) 10 MG tablet Take 1 tablet (10 mg total) by mouth daily.  . [DISCONTINUED] ondansetron (ZOFRAN) 4 MG tablet Take 1 tablet (4 mg total) by mouth every 8 (eight) hours as needed for nausea or vomiting.  . [DISCONTINUED] potassium chloride SA (KLOR-CON) 20 MEQ tablet Take 1 tablet (20 mEq total) by mouth daily.   Allergies  Allergen Reactions  . Trelegy Ellipta [Fluticasone-Umeclidin-Vilant]     Shortness of breath  . Codeine Nausea And Vomiting  . Lipitor [Atorvastatin] Nausea And Vomiting  . Omeprazole     unknown  . Vicodin [Hydrocodone-Acetaminophen]     unknown  . Zyban [Bupropion]     unknown  . Mevacor [Lovastatin] Itching and Rash   Recent Results (from the past 2160 hour(s))  Hepatitis C antibody     Status: None   Collection Time: 01/23/21  9:25 AM  Result Value Ref Range   Hepatitis C Ab NON-REACTIVE NON-REACTI   SIGNAL TO CUT-OFF 0.00 <1.00    Comment: . HCV antibody was non-reactive. There is no laboratory  evidence of HCV infection. . In most cases, no further action is required. However, if recent HCV exposure is suspected, a test for HCV RNA (test code [id number]) is suggested. . For additional information please refer to http://education.questdiagn[facility].com/faq/FAQ22v1 (This link is being provided for informational/ educational purposes only.) .   CBC with Differential/Platelet     Status: None   Collection Time: 01/23/21  9:25 AM  Result Value Ref Range   WBC 6.1 4.0 - 10.5  K/uL   RBC 4.37 3.87 - 5.11 Mil/uL   Hemoglobin 13.1 12.0 - 15.0 g/dL   HCT 40.2 36.0 - 46.0 %   MCV 92.0 78.0 - 100.0 fl   MCHC 32.7 30.0 - 36.0 g/dL   RDW 13.2 11.5 - 15.5 %   Platelets 237.0 150.0 - 400.0 K/uL   Neutrophils Relative % 56.6 43.0 - 77.0 %   Lymphocytes Relative 34.1 12.0 - 46.0 %   Monocytes Relative 7.3 3.0 - 12.0 %   Eosinophils Relative 1.1 0.0 - 5.0 %  Basophils Relative 0.9 0.0 - 3.0 %   Neutro Abs 3.5 1.4 - 7.7 K/uL   Lymphs Abs 2.1 0.7 - 4.0 K/uL   Monocytes Absolute 0.4 0.1 - 1.0 K/uL   Eosinophils Absolute 0.1 0.0 - 0.7 K/uL   Basophils Absolute 0.1 0.0 - 0.1 K/uL  Lipid panel     Status: Abnormal   Collection Time: 01/23/21  9:25 AM  Result Value Ref Range   Cholesterol 215 (H) 0 - 200 mg/dL    Comment: ATP III Classification       Desirable:  < 200 mg/dL               Borderline High:  200 - 239 mg/dL          High:  > = 240 mg/dL   Triglycerides 97.0 0.0 - 149.0 mg/dL    Comment: Normal:  <150 mg/dLBorderline High:  150 - 199 mg/dL   HDL 90.20 >39.00 mg/dL   VLDL 19.4 0.0 - 40.0 mg/dL   LDL Cholesterol 105 (H) 0 - 99 mg/dL   Total CHOL/HDL Ratio 2     Comment:                Men          Women1/2 Average Risk     3.4          3.3Average Risk          5.0          4.42X Average Risk          9.6          7.13X Average Risk          15.0          11.0                       NonHDL 124.71     Comment: NOTE:  Non-HDL goal should be 30 mg/dL higher than patient's LDL goal (i.e. LDL goal of < 70 mg/dL, would have non-HDL goal of < 100 mg/dL)  Comprehensive metabolic panel     Status: Abnormal   Collection Time: 01/23/21  9:25 AM  Result Value Ref Range   Sodium 140 135 - 145 mEq/L   Potassium 3.7 3.5 - 5.1 mEq/L   Chloride 100 96 - 112 mEq/L   CO2 34 (H) 19 - 32 mEq/L   Glucose, Bld 106 (H) 70 - 99 mg/dL   BUN 27 (H) 6 - 23 mg/dL   Creatinine, Ser 0.93 0.40 - 1.20 mg/dL   Total Bilirubin 1.1 0.2 - 1.2 mg/dL   Alkaline Phosphatase 32 (L) 39 - 117 U/L    AST 17 0 - 37 U/L   ALT 11 0 - 35 U/L   Total Protein 6.3 6.0 - 8.3 g/dL   Albumin 4.0 3.5 - 5.2 g/dL   GFR 56.18 (L) >60.00 mL/min    Comment: Calculated using the CKD-EPI Creatinine Equation (2021)   Calcium 9.0 8.4 - 10.5 mg/dL  Urinalysis, Routine w reflex microscopic     Status: None   Collection Time: 01/24/21  3:10 PM  Result Value Ref Range   Specific Gravity, UA 1.015 1.005 - 1.030   pH, UA 7.0 5.0 - 7.5   Color, UA Yellow Yellow   Appearance Ur Clear Clear   Leukocytes,UA Negative Negative   Protein,UA Negative Negative/Trace   Glucose, UA Negative Negative   Ketones, UA Negative Negative  RBC, UA Negative Negative   Bilirubin, UA Negative Negative   Urobilinogen, Ur 0.2 0.2 - 1.0 mg/dL   Nitrite, UA Negative Negative   Microscopic Examination Comment     Comment: Microscopic not indicated and not performed.   Objective  Body mass index is 23.21 kg/m. Wt Readings from Last 3 Encounters:  03/13/21 131 lb (59.4 kg)  01/09/21 131 lb 9.6 oz (59.7 kg)  10/26/20 133 lb 6.4 oz (60.5 kg)   Temp Readings from Last 3 Encounters:  01/09/21 97.9 F (36.6 C)  10/26/20 98.1 F (36.7 C) (Oral)  10/20/20 97.7 F (36.5 C) (Tympanic)   BP Readings from Last 3 Encounters:  03/13/21 108/66  01/09/21 (!) 194/78  10/26/20 116/66   Pulse Readings from Last 3 Encounters:  03/13/21 95  01/09/21 92  10/26/20 91    Physical Exam Vitals and nursing note reviewed.  Constitutional:      Appearance: Normal appearance. She is well-developed and well-groomed.  HENT:     Head: Normocephalic and atraumatic.  Eyes:     Conjunctiva/sclera: Conjunctivae normal.     Pupils: Pupils are equal, round, and reactive to light.  Cardiovascular:     Rate and Rhythm: Normal rate and regular rhythm.     Heart sounds: Normal heart sounds. No murmur heard.   Pulmonary:     Effort: Pulmonary effort is normal.     Breath sounds: Normal breath sounds.  Skin:    General: Skin is warm and  dry.  Neurological:     General: No focal deficit present.     Mental Status: She is alert and oriented to person, place, and time. Mental status is at baseline.     Gait: Gait normal.  Psychiatric:        Attention and Perception: Attention and perception normal.        Mood and Affect: Mood and affect normal.        Speech: Speech normal.        Behavior: Behavior normal. Behavior is cooperative.        Thought Content: Thought content normal.        Cognition and Memory: Cognition and memory normal.        Judgment: Judgment normal.     Assessment  Plan  Hypertension, unspecified type On norvasc 5 mg qd on lis hctz 10-12.5 mg qd   COPD, severe (HCC) on 2-3 L F/u Dr. 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 Grayland Ormond is Tamoxifen causing nausea would other oral cancer medications be considered if so   Hyperlipidemia, unspecified hyperlipidemia type - Plan: ezetimibe (ZETIA) 10 MG tablet  Nausea could be due to tamoxifen - Plan: ondansetron (ZOFRAN) 4 MG tablet  Hypokalemia - Plan: potassium chloride SA (KLOR-CON) 20 MEQ tablet  Hair loss  F/u dermatology 06/2021   HM Flu  09/28/20  -prevnar late 11/2020 and 12/2020 and consider repeat pna 23 in 1 year  01/18/20 moderna 3/3 10/29/20   Tdap 02/09/16    Out of age window pap, colonoscopy mammo 09/21/20 abnormal bx 10/02/20 + right DCIS breast cancer with est Dr. Grayland Ormond consider Dr. Barry Dienes in future as of 10/30/20 declines Also disc MRI b/l breast h/o he states she  is not candidate  mammo sch 04/24/21 and appt Dr. Grayland Ormond 04/26/21 prolia 04/25/21  DUCTAL CARCINOMA IN SITU, HIGH-GRADE WITH COMEDONECROSIS, WITH  ASSOCIATED CALCIFICATIONS.  - NEGATIVE FOR  INVASIVE MAMMARY CARCINOMA. Consider DEXAif has not hadordered dexa can have with mammogram -call to schedule  Former smoker with COPD on cont O2   Osteoporosis with prolia shot today 10/26/20 next due 04/25/21   H/o Dr. Grayland Ormond  GI Dr. Tiffany Kocher  Lung Dr. Rosita Kea Dr. Michaelene Song AE Provider: Dr. Olivia Mackie McLean-Scocuzza-[facility]

## 2021-03-24 DIAGNOSIS — J449 Chronic obstructive pulmonary disease, unspecified: Secondary | ICD-10-CM | POA: Diagnosis not present

## 2021-04-05 ENCOUNTER — Ambulatory Visit (INDEPENDENT_AMBULATORY_CARE_PROVIDER_SITE_OTHER): Payer: Medicare Other

## 2021-04-05 VITALS — Ht 63.0 in | Wt 131.0 lb

## 2021-04-05 DIAGNOSIS — Z0000 Encounter for general adult medical examination without abnormal findings: Secondary | ICD-10-CM | POA: Diagnosis not present

## 2021-04-05 NOTE — Patient Instructions (Addendum)
Kristen Bailey , Thank you for taking time to come for your Medicare Wellness Visit. I appreciate your ongoing commitment to your health goals. Please review the following plan we discussed and let me know if I can assist you in the future.   These are the goals we discussed: Goals      Patient Stated   .  Medication Monitoring (pt-stated)      Patient Goals/Self-Care Activities . Over the next 90 days, patient will:  - take medications as prescribed check blood pressure periodically, document, and provide at future appointments collaborate with provider on medication access solutions         Other   .  Increase physical activity      As tolerated       This is a list of the screening recommended for you and due dates:  Health Maintenance  Topic Date Due  . COVID-19 Vaccine (4 - Booster for Moderna series) 06/10/2021  . Flu Shot  07/16/2021  . Tetanus Vaccine  02/08/2026  . DEXA scan (bone density measurement)  Completed  . Pneumonia vaccines  Completed  . HPV Vaccine  Aged Out   Immunizations Immunization History  Administered Date(s) Administered  . Fluad Quad(high Dose 65+) 09/28/2020  . Influenza-Unspecified 09/17/2012, 09/01/2015, 09/27/2019  . Moderna Sars-Covid-2 Vaccination 01/18/2020, 02/15/2020, 12/10/2020  . Pneumococcal Conjugate-13 01/23/2021  . Pneumococcal-Unspecified 01/23/2004  . Tdap 11/18/2014, 02/09/2016   Keep all routine maintenance appointments.   Nurse visit Prolia 04/25/21 @ 1030   CCM 05/22/21 @ 9:00  Follow up 07/13/21 @ 1:30  Advanced directives: End of life planning; Advance aging; Advanced directives discussed.  Copy of current HCPOA/Living Will requested.    Follow up in one year for your annual wellness visit.   Preventive Care 4 Years and Older, Female Preventive care refers to lifestyle choices and visits with your health care provider that can promote health and wellness. What does preventive care include?  A yearly physical  exam. This is also called an annual well check.  Dental exams once or twice a year.  Routine eye exams. Ask your health care provider how often you should have your eyes checked.  Personal lifestyle choices, including:  Daily care of your teeth and gums.  Regular physical activity.  Eating a healthy diet.  Avoiding tobacco and drug use.  Limiting alcohol use.  Practicing safe sex.  Taking low-dose aspirin every day.  Taking vitamin and mineral supplements as recommended by your health care provider. What happens during an annual well check? The services and screenings done by your health care provider during your annual well check will depend on your age, overall health, lifestyle risk factors, and family history of disease. Counseling  Your health care provider may ask you questions about your:  Alcohol use.  Tobacco use.  Drug use.  Emotional well-being.  Home and relationship well-being.  Sexual activity.  Eating habits.  History of falls.  Memory and ability to understand (cognition).  Work and work Statistician.  Reproductive health. Screening  You may have the following tests or measurements:  Height, weight, and BMI.  Blood pressure.  Lipid and cholesterol levels. These may be checked every 5 years, or more frequently if you are over 57 years old.  Skin check.  Lung cancer screening. You may have this screening every year starting at age 62 if you have a 30-pack-year history of smoking and currently smoke or have quit within the past 15 years.  Fecal occult  blood test (FOBT) of the stool. You may have this test every year starting at age 38.  Flexible sigmoidoscopy or colonoscopy. You may have a sigmoidoscopy every 5 years or a colonoscopy every 10 years starting at age 44.  Hepatitis C blood test.  Hepatitis B blood test.  Sexually transmitted disease (STD) testing.  Diabetes screening. This is done by checking your blood sugar (glucose)  after you have not eaten for a while (fasting). You may have this done every 1-3 years.  Bone density scan. This is done to screen for osteoporosis. You may have this done starting at age 41.  Mammogram. This may be done every 1-2 years. Talk to your health care provider about how often you should have regular mammograms. Talk with your health care provider about your test results, treatment options, and if necessary, the need for more tests. Vaccines  Your health care provider may recommend certain vaccines, such as:  Influenza vaccine. This is recommended every year.  Tetanus, diphtheria, and acellular pertussis (Tdap, Td) vaccine. You may need a Td booster every 10 years.  Zoster vaccine. You may need this after age 70.  Pneumococcal 13-valent conjugate (PCV13) vaccine. One dose is recommended after age 65.  Pneumococcal polysaccharide (PPSV23) vaccine. One dose is recommended after age 61. Talk to your health care provider about which screenings and vaccines you need and how often you need them. This information is not intended to replace advice given to you by your health care provider. Make sure you discuss any questions you have with your health care provider. Document Released: 12/29/2015 Document Revised: 08/21/2016 Document Reviewed: 10/03/2015 Elsevier Interactive Patient Education  2017 [employer] Prevention in the Home Falls can cause injuries. They can happen to people of all ages. There are many things you can do to make your home safe and to help prevent falls. What can I do on the outside of my home?  Regularly fix the edges of walkways and driveways and fix any cracks.  Remove anything that might make you trip as you walk through a door, such as a raised step or threshold.  Trim any bushes or trees on the path to your home.  Use bright outdoor lighting.  Clear any walking paths of anything that might make someone trip, such as rocks or  tools.  Regularly check to see if handrails are loose or broken. Make sure that both sides of any steps have handrails.  Any raised decks and porches should have guardrails on the edges.  Have any leaves, snow, or ice cleared regularly.  Use sand or salt on walking paths during winter.  Clean up any spills in your garage right away. This includes oil or grease spills. What can I do in the bathroom?  Use night lights.  Install grab bars by the toilet and in the tub and shower. Do not use towel bars as grab bars.  Use non-skid mats or decals in the tub or shower.  If you need to sit down in the shower, use a plastic, non-slip stool.  Keep the floor dry. Clean up any water that spills on the floor as soon as it happens.  Remove soap buildup in the tub or shower regularly.  Attach bath mats securely with double-sided non-slip rug tape.  Do not have throw rugs and other things on the floor that can make you trip. What can I do in the bedroom?  Use night lights.  Make sure that you have  a light by your bed that is easy to reach.  Do not use any sheets or blankets that are too big for your bed. They should not hang down onto the floor.  Have a firm chair that has side arms. You can use this for support while you get dressed.  Do not have throw rugs and other things on the floor that can make you trip. What can I do in the kitchen?  Clean up any spills right away.  Avoid walking on wet floors.  Keep items that you use a lot in easy-to-reach places.  If you need to reach something above you, use a strong step stool that has a grab bar.  Keep electrical cords out of the way.  Do not use floor polish or wax that makes floors slippery. If you must use wax, use non-skid floor wax.  Do not have throw rugs and other things on the floor that can make you trip. What can I do with my stairs?  Do not leave any items on the stairs.  Make sure that there are handrails on both  sides of the stairs and use them. Fix handrails that are broken or loose. Make sure that handrails are as [place] as the stairways.  Check any carpeting to make sure that it is firmly attached to the stairs. Fix any carpet that is loose or worn.  Avoid having throw rugs at the top or bottom of the stairs. If you do have throw rugs, attach them to the floor with carpet tape.  Make sure that you have a light switch at the top of the stairs and the bottom of the stairs. If you do not have them, ask someone to add them for you. What else can I do to help prevent falls?  Wear shoes that:  Do not have high heels.  Have rubber bottoms.  Are comfortable and fit you well.  Are closed at the toe. Do not wear sandals.  If you use a stepladder:  Make sure that it is fully opened. Do not climb a closed stepladder.  Make sure that both sides of the stepladder are locked into place.  Ask someone to hold it for you, if possible.  Clearly mark and make sure that you can see:  Any grab bars or handrails.  First and last steps.  Where the edge of each step is.  Use tools that help you move around (mobility aids) if they are needed. These include:  Canes.  Walkers.  Scooters.  Crutches.  Turn on the lights when you go into a dark area. Replace any light bulbs as soon as they burn out.  Set up your furniture so you have a clear path. Avoid moving your furniture around.  If any of your floors are uneven, fix them.  If there are any pets around you, be aware of where they are.  Review your medicines with your doctor. Some medicines can make you feel dizzy. This can increase your chance of falling. Ask your doctor what other things that you can do to help prevent falls. This information is not intended to replace advice given to you by your health care provider. Make sure you discuss any questions you have with your health care provider. Document Released: 09/28/2009 Document Revised:  05/09/2016 Document Reviewed: 01/06/2015 Elsevier Interactive Patient Education  2017 [employer].

## 2021-04-05 NOTE — Progress Notes (Signed)
Subjective:   Kristen Bailey is a 87 y.o. female who presents for Medicare Annual (Subsequent) preventive examination.  Review of Systems    No ROS.  Medicare Wellness Virtual Visit.   Cardiac Risk Factors include: advanced age (>63men, >37 women);hypertension     Objective:    Today's Vitals   04/05/21 1033  Weight: 131 lb (59.4 kg)  Height: 5\' 3"  (1.6 m)   Body mass index is 23.21 kg/m.  Advanced Directives 04/05/2021 01/25/2021 10/19/2020 10/09/2020 02/04/2020 05/05/2019 06/24/2017  Does Patient Have a Medical Advance Directive? Yes Yes Yes Yes Yes Yes Yes  Type of Paramedic of [place];Living will Healthcare Power of [place] of [place];Living will  Does patient want to make changes to medical advance directive? No - Patient declined - No - Patient declined No - Patient declined No - Patient declined - -  Copy of [place] in Chart? No - copy requested - No - copy requested No - copy requested No - copy requested - No - copy requested  Would patient like information on creating a medical advance directive? - - - - - - -    Current Medications (verified) Outpatient Encounter Medications as of 04/05/2021  Medication Sig  . acetaminophen (TYLENOL) 500 MG tablet Take 500 mg by mouth every 6 (six) hours as needed.  Marland Kitchen albuterol (VENTOLIN HFA) 108 (90 Base) MCG/ACT inhaler Inhale 1-2 puffs into the lungs every 6 (six) hours as needed for wheezing or shortness of breath.  Marland Kitchen amLODipine (NORVASC) 5 MG tablet Take 1 tablet (5 mg total) by mouth daily.  . Budeson-Glycopyrrol-Formoterol (BREZTRI AEROSPHERE) 160-9-4.8 MCG/ACT AERO Inhale 2 puffs into the lungs in the morning and at bedtime.  . calcium carbonate (OSCAL) 1500 (600 Ca) MG TABS tablet Take 1,500 mg by mouth 2 (two) times daily with a meal.    . Cholecalciferol 25 MCG (1000 UT) tablet Take 1,000 Units by mouth daily.   Marland Kitchen denosumab (PROLIA) 60 MG/ML SOSY injection Inject 60 mg into the skin every 6 (six) months. (Patient not taking: Reported on 03/13/2021)  . ezetimibe (ZETIA) 10 MG tablet Take 1 tablet (10 mg total) by mouth daily.  . fluticasone (FLONASE) 50 MCG/ACT nasal spray Place into the nose daily as needed.   . levalbuterol (XOPENEX) 0.63 MG/3ML nebulizer solution Take 0.63 mg by nebulization every 4 (four) hours as needed for wheezing or shortness of breath.  . lisinopril-hydrochlorothiazide (ZESTORETIC) 10-12.5 MG tablet Take 1 tablet by mouth daily.  Marland Kitchen LORazepam (ATIVAN) 1 MG tablet Take 1 mg by mouth 2 (two) times daily.   . Magnesium 400 MG TABS Take by mouth daily.  . Omega-3 Fatty Acids (FISH OIL) 1000 MG CAPS Take 1 capsule by mouth daily.  . ondansetron (ZOFRAN) 4 MG tablet Take 1 tablet (4 mg total) by mouth every 8 (eight) hours as needed for nausea or vomiting.  . potassium chloride SA (KLOR-CON) 20 MEQ tablet Take 1 tablet (20 mEq total) by mouth daily.  . tamoxifen (NOLVADEX) 20 MG tablet Take 1 tablet (20 mg total) by mouth daily.   No facility-administered encounter medications on file as of 04/05/2021.    Allergies (verified) Trelegy ellipta [fluticasone-umeclidin-vilant], Codeine, Lipitor [atorvastatin], Omeprazole, Vicodin [hydrocodone-acetaminophen], Zyban [bupropion], and Mevacor [lovastatin]   History: Past Medical History:  Diagnosis Date  . Anxiety   . Chicken pox   .  COPD (chronic obstructive pulmonary disease) ([place])   . Essential tremor   . Hypertension   . Hypomagnesemia   . Irritable bowel syndrome (IBS)   . 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 ABDOMINAL HYSTERECTOMY    . APPENDECTOMY    . BREAST BIOPSY Right 10/05/2020   stereo bx, x-clip, path  pending   . CHOLECYSTECTOMY    . OVARIAN CYST REMOVAL    . SMALL INTESTINE SURGERY     Family History  Problem Relation Age of Onset  . Heart attack Mother   . Heart disease Mother   . Hypertension Mother   . Cancer Father        bladder smoker  . Heart disease Brother        MI in 60s   . Breast cancer Neg Hx    Social History   Socioeconomic History  . Marital status: Widowed    Spouse name: Not on file  . Number of children: Not on file  . Years of education: Not on file  . Highest education level: Not on file  Occupational History  . Not on file  Tobacco Use  . Smoking status: Former Research scientist (life sciences)  . Smokeless tobacco: Never Used  Substance and Sexual Activity  . Alcohol use: No  . Drug use: No  . Sexual activity: Not Currently  Other Topics Concern  . Not on file  Social History Narrative   Has 4 children 3 girls and 1 boy   Former smoker    Lives in in [place] with white [employer]         Social Determinants of Health   Financial Resource Strain: Low Risk   . Difficulty of Paying Living Expenses: Not very hard  Food Insecurity: No Food Insecurity  . Worried About Charity fundraiser in the Last Year: Never true  . Ran Out of Food in the Last Year: Never true  Transportation Needs: No Transportation Needs  . Lack of Transportation (Medical): No  . Lack of Transportation (Non-Medical): No  Physical Activity: Not on file  Stress: No Stress Concern Present  . Feeling of Stress : Not at all  Social Connections: Unknown  . Frequency of Communication with Friends and Family: Not on file  . Frequency of Social Gatherings with Friends and Family: More than three times a week  . Attends Religious Services: Not on file  . Active Member of Clubs or Organizations: Not on file  . Attends Archivist Meetings: Not on file  . Marital Status: Not on file    Tobacco Counseling Counseling given: Not Answered   Clinical  Intake:  Pre-visit preparation completed: Yes        Diabetes: No  How often do you need to have someone help you when you read instructions, pamphlets, or other written materials from your doctor or pharmacy?: 1 - Never    Interpreter Needed?: No      Activities of Daily Living In your present state of health, do you have any difficulty performing the following activities: 04/05/2021  Hearing? N  Vision? N  Difficulty concentrating or making decisions? N  Walking or climbing stairs? Y  Dressing or bathing? N  Doing errands, shopping? Y  Preparing Food and eating ? Y  Comment Staff assist meal prep. Self feeds.  Using the Toilet? N  In the past six months, have you accidently leaked urine? Y  Comment Managed with Depends incontinent brief  Do you have problems with loss of bowel control? N  Managing your Medications? N  Managing your Finances? N  Housekeeping or managing your Housekeeping? Y  Comment Maid assist  Some recent data might be hidden    Patient Care Team: McLean-Scocuzza, Nino Glow, MD as PCP - General (Internal Medicine) De Hollingshead, RPH-CPP (Pharmacist) Theodore Demark, RN as Oncology Nurse Navigator Grayland Ormond, Kathlene November, MD as Consulting Physician (Oncology)  Indicate any recent Medical Services you may have received from other than [facility] providers in the past year (date may be approximate).     Assessment:   This is a routine wellness examination for [place].  I connected with Nashali today by telephone and verified that I am speaking with the correct person using two identifiers. Location patient: home Location provider: work Persons participating in the virtual visit: patient, Marine scientist.    I discussed the limitations, risks, security and privacy concerns of performing an evaluation and management service by telephone and the availability of in person appointments. The patient expressed understanding and verbally consented to this  telephonic visit.    Interactive audio and video telecommunications were attempted between this provider and patient, however failed, due to patient having technical difficulties OR patient did not have access to video capability.  We continued and completed visit with audio only.  Some vital signs may be absent or patient reported.   Hearing/Vision screen  Hearing Screening   125Hz  250Hz  500Hz  1000Hz  2000Hz  3000Hz  4000Hz  6000Hz  8000Hz   Right ear:           Left ear:           Comments: Patient is able to hear conversational tones without difficulty.  No issues reported.  Vision Screening Comments: Wears corrective lenses  Cataract extraction, bilateral  Visual acuity not assessed, virtual visit. They have seen their ophthalmologist.  Dietary issues and exercise activities discussed: Current Exercise Habits: The patient does not participate in regular exercise at present  Regular diet Good water intake  Goals      Patient Stated   .  Medication Monitoring (pt-stated)      Patient Goals/Self-Care Activities . Over the next 90 days, patient will:  - take medications as prescribed check blood pressure periodically, document, and provide at future appointments collaborate with provider on medication access solutions         Other   .  Increase physical activity      As tolerated      Depression Screen PHQ 2/9 Scores 04/05/2021 04/11/2020 03/22/2020 02/04/2020 01/14/2020 10/13/2019  PHQ - 2 Score 0 0 0 0 0 0    Fall Risk Fall Risk  04/05/2021 03/13/2021 10/26/2020 10/10/2020 09/13/2020  Falls in the past year? 1 0 0 0 0  Number falls in past yr: 0 0 0 0 0  Injury with Fall? 0 0 0 0 0  Risk for fall due to : Impaired balance/gait Impaired balance/gait - - -  Follow up Falls evaluation completed Falls evaluation completed Falls evaluation completed - Falls evaluation completed    FALL RISK PREVENTION PERTAINING TO THE HOME: Handrails in use when climbing stairs? Yes Home free  of loose throw rugs in walkways, pet beds, electrical cords, etc? Yes  Adequate lighting in your home to reduce risk of falls? Yes   ASSISTIVE DEVICES UTILIZED TO PREVENT FALLS:  Life alert? Yes  Use of a cane, walker or w/c? Yes  Grab bars in the bathroom? Yes  Shower chair or bench in shower? Yes  Elevated toilet seat or a handicapped toilet? Yes   TIMED UP AND GO: Was the test performed? No . Virtual visit.   Cognitive Function:     6CIT Screen 04/05/2021 02/04/2020  What Year? 0 points 0 points  What month? 0 points 0 points  What time? 0 points 0 points  Count back from 20 0 points 0 points  Months in reverse 0 points 0 points  Repeat phrase 0 points -  Total Score 0 -   Immunizations Immunization History  Administered Date(s) Administered  . Fluad Quad(high Dose 65+) 09/28/2020  . Influenza-Unspecified 09/17/2012, 09/01/2015, 09/27/2019  . Moderna Sars-Covid-2 Vaccination 01/18/2020, 02/15/2020, 12/10/2020  . Pneumococcal Conjugate-13 01/23/2021  . Pneumococcal-Unspecified 01/23/2004  . Tdap 11/18/2014, 02/09/2016   Health Maintenance There are no preventive care reminders to display for this patient. Health Maintenance  Topic Date Due  . COVID-19 Vaccine (4 - Booster for Moderna series) 06/10/2021  . INFLUENZA VACCINE  07/16/2021  . TETANUS/TDAP  02/08/2026  . DEXA SCAN  Completed  . PNA vac Low Risk Adult  Completed  . HPV VACCINES  Aged Out   Colorectal cancer screening: No longer required.   Mammogram- scheduled 04/24/21.   Bone Density status: Completed 08/24/20. Results reflect: Bone density results: OSTEOPOROSIS. Repeat every 2 years. Prolia injection.   Lung Cancer Screening: (Low Dose CT Chest recommended if Age 2-80 years, 30 pack-year currently smoking OR have quit w/in 15years.) does not qualify.   Hepatitis C Screening: does not qualify.  Vision Screening: Recommended annual ophthalmology exams for early detection of glaucoma and other disorders  of the eye. Is the patient up to date with their annual eye exam?  Yes   Dental Screening: Recommended annual dental exams for proper oral hygiene.  Community Resource Referral / Chronic Care Management: CRR required this visit?  No   CCM required this visit?  No      Plan:   Keep all routine maintenance appointments.   Nurse visit Prolia 04/25/21 @ 1030   CCM 05/22/21 @ 9:00  Follow up 07/13/21 @ 1:30  I have personally reviewed and noted the following in the patient's chart:   . Medical and social history . Use of alcohol, tobacco or illicit drugs  . Current medications and supplements . Functional ability and status . Nutritional status . Physical activity . Advanced directives . List of other physicians . Hospitalizations, surgeries, and ER visits in previous 12 months . Vitals . Screenings to include cognitive, depression, and falls . Referrals and appointments  In addition, I have reviewed and discussed with patient certain preventive protocols, quality metrics, and best practice recommendations. A written personalized care plan for preventive services as well as general preventive health recommendations were provided to patient via mail.     Varney Biles, LPN   05/17/931

## 2021-04-23 DIAGNOSIS — J449 Chronic obstructive pulmonary disease, unspecified: Secondary | ICD-10-CM | POA: Diagnosis not present

## 2021-04-24 ENCOUNTER — Ambulatory Visit
Admission: RE | Admit: 2021-04-24 | Discharge: 2021-04-24 | Disposition: A | Discharge status: Home / Self Care | Payer: Medicare Other | Source: Ambulatory Visit | Attending: Oncology | Admitting: Oncology

## 2021-04-24 ENCOUNTER — Other Ambulatory Visit: Payer: Self-pay

## 2021-04-24 DIAGNOSIS — D0511 Intraductal carcinoma in situ of right breast: Secondary | ICD-10-CM | POA: Diagnosis not present

## 2021-04-24 DIAGNOSIS — R922 Inconclusive mammogram: Secondary | ICD-10-CM | POA: Diagnosis not present

## 2021-04-24 DIAGNOSIS — R921 Mammographic calcification found on diagnostic imaging of breast: Secondary | ICD-10-CM | POA: Diagnosis not present

## 2021-04-25 ENCOUNTER — Other Ambulatory Visit
Admission: RE | Admit: 2021-04-25 | Discharge: 2021-04-25 | Disposition: A | Discharge status: Home / Self Care | Payer: Medicare Other | Source: Ambulatory Visit | Attending: Internal Medicine | Admitting: Internal Medicine

## 2021-04-25 ENCOUNTER — Ambulatory Visit (INDEPENDENT_AMBULATORY_CARE_PROVIDER_SITE_OTHER): Payer: Medicare Other

## 2021-04-25 DIAGNOSIS — I739 Peripheral vascular disease, unspecified: Secondary | ICD-10-CM | POA: Diagnosis not present

## 2021-04-25 DIAGNOSIS — J439 Emphysema, unspecified: Secondary | ICD-10-CM | POA: Diagnosis not present

## 2021-04-25 DIAGNOSIS — R609 Edema, unspecified: Secondary | ICD-10-CM | POA: Insufficient documentation

## 2021-04-25 DIAGNOSIS — Z9981 Dependence on supplemental oxygen: Secondary | ICD-10-CM | POA: Diagnosis not present

## 2021-04-25 DIAGNOSIS — I10 Essential (primary) hypertension: Secondary | ICD-10-CM | POA: Insufficient documentation

## 2021-04-25 DIAGNOSIS — R0602 Shortness of breath: Secondary | ICD-10-CM | POA: Insufficient documentation

## 2021-04-25 DIAGNOSIS — Z853 Personal history of malignant neoplasm of breast: Secondary | ICD-10-CM | POA: Diagnosis not present

## 2021-04-25 DIAGNOSIS — J449 Chronic obstructive pulmonary disease, unspecified: Secondary | ICD-10-CM | POA: Diagnosis not present

## 2021-04-25 DIAGNOSIS — R600 Localized edema: Secondary | ICD-10-CM | POA: Diagnosis not present

## 2021-04-25 DIAGNOSIS — M810 Age-related osteoporosis without current pathological fracture: Secondary | ICD-10-CM

## 2021-04-25 LAB — BRAIN NATRIURETIC PEPTIDE: B Natriuretic Peptide: 98.2 pg/mL (ref 0.0–100.0)

## 2021-04-25 MED ORDER — DENOSUMAB 60 MG/ML ~~LOC~~ SOSY
60.0000 mg | PREFILLED_SYRINGE | Freq: Once | SUBCUTANEOUS | Status: AC
  Administered 2021-04-25: 60 mg via SUBCUTANEOUS

## 2021-04-25 NOTE — Progress Notes (Signed)
Patient presented for 6-month Prolia injection SQ to right arm. Patient tolerated well. 

## 2021-04-26 ENCOUNTER — Other Ambulatory Visit: Payer: Self-pay

## 2021-04-26 ENCOUNTER — Inpatient Hospital Stay: Payer: Medicare Other | Attending: Oncology | Admitting: Oncology

## 2021-04-26 ENCOUNTER — Encounter: Payer: Self-pay | Admitting: Oncology

## 2021-04-26 VITALS — BP 103/48 | HR 67 | Temp 97.6°F | Resp 17 | Ht 63.0 in | Wt 129.8 lb

## 2021-04-26 DIAGNOSIS — D0511 Intraductal carcinoma in situ of right breast: Secondary | ICD-10-CM | POA: Diagnosis not present

## 2021-04-26 DIAGNOSIS — Z79899 Other long term (current) drug therapy: Secondary | ICD-10-CM | POA: Insufficient documentation

## 2021-04-26 DIAGNOSIS — Z79810 Long term (current) use of selective estrogen receptor modulators (SERMs): Secondary | ICD-10-CM | POA: Diagnosis not present

## 2021-04-26 DIAGNOSIS — Z87891 Personal history of nicotine dependence: Secondary | ICD-10-CM | POA: Insufficient documentation

## 2021-04-26 NOTE — Progress Notes (Signed)
[facility]  [phone] [phone]) [phone]  ID: Magin Balbi Lasecki OB: [birth year]  MR#: [id number]  LZJ#:[id number]  Patient Care Team: McLean-Scocuzza, Nino Glow, MD as PCP - General (Internal Medicine) De Hollingshead, RPH-CPP (Pharmacist) Theodore Demark, RN as Oncology Nurse Navigator Grayland Ormond, Kathlene November, MD as Consulting Physician (Oncology)  CHIEF COMPLAINT: DCIS, right breast.  INTERVAL HISTORY: Patient returns to clinic today for routine 54-month evaluation and discussion of her mammogram results.  She continues to have chronic shortness of breath requiring oxygen.  She has chronic weakness and fatigue.  She otherwise feels well.  She has no neurologic complaints.  She denies any recent fevers or illnesses.  She has a good appetite and denies weight loss.  She has no chest pain, cough, or hemoptysis.  She denies any nausea, vomiting, constipation, or diarrhea.  She has no urinary complaints.  Patient offers no further specific complaints today.  REVIEW OF SYSTEMS:   Review of Systems  Constitutional: Positive for malaise/fatigue. Negative for fever and weight loss.  Respiratory: Positive for shortness of breath. Negative for cough and hemoptysis.   Cardiovascular: Negative.  Negative for chest pain and leg swelling.  Gastrointestinal: Negative.  Negative for abdominal pain.  Genitourinary: Negative.  Negative for dysuria.  Musculoskeletal: Negative.  Negative for back pain.  Skin: Negative.  Negative for rash.  Neurological: Positive for weakness. Negative for dizziness, focal weakness and headaches.  Psychiatric/Behavioral: Negative.  The patient is not nervous/anxious.     As per HPI. Otherwise, a complete review of systems is negative.  PAST MEDICAL HISTORY: Past Medical History:  Diagnosis Date  . Anxiety   . Chicken pox   . COPD (chronic obstructive pulmonary disease) ([place])   . Essential tremor   . Hypertension   . Hypomagnesemia    . Irritable bowel syndrome (IBS)   . 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 ABDOMINAL HYSTERECTOMY    . APPENDECTOMY    . BREAST BIOPSY Right 10/05/2020   stereo bx, x-clip, positive DCIS  . CHOLECYSTECTOMY    . OVARIAN CYST REMOVAL    . SMALL INTESTINE SURGERY      FAMILY HISTORY: Family History  Problem Relation Age of Onset  . Heart attack Mother   . Heart disease Mother   . Hypertension Mother   . Cancer Father        bladder smoker  . Heart disease Brother        MI in 80s   . Breast cancer Neg Hx     ADVANCED DIRECTIVES (Y/N):  N  HEALTH MAINTENANCE: Social History   Tobacco Use  . Smoking status: Former Research scientist (life sciences)  . Smokeless tobacco: Never Used  Substance Use Topics  . Alcohol use: No  . Drug use: No     Colonoscopy:  PAP:  Bone density:  Lipid panel:  Allergies  Allergen Reactions  . Trelegy Ellipta [Fluticasone-Umeclidin-Vilant]     Shortness of breath  . Codeine Nausea And Vomiting  . Lipitor [Atorvastatin] Nausea And Vomiting  . Omeprazole     unknown  . Vicodin [Hydrocodone-Acetaminophen]     unknown  . Zyban [Bupropion]     unknown  . Mevacor [Lovastatin] Itching and Rash    Current Outpatient Medications  Medication Sig Dispense Refill  . acetaminophen (TYLENOL) 500 MG tablet  Take 500 mg by mouth every 6 (six) hours as needed.    Marland Kitchen albuterol (VENTOLIN HFA) 108 (90 Base) MCG/ACT inhaler Inhale 1-2 puffs into the lungs every 6 (six) hours as needed for wheezing or shortness of breath.    . Budeson-Glycopyrrol-Formoterol (BREZTRI AEROSPHERE) 160-9-4.8 MCG/ACT AERO Inhale 2 puffs into the lungs in the morning and at bedtime.    . calcium carbonate (OSCAL) 1500 (600 Ca) MG TABS tablet Take 1,500 mg by mouth 2 (two) times daily with a meal.     . Cholecalciferol 25 MCG (1000 UT) tablet  Take 1,000 Units by mouth daily.     Marland Kitchen denosumab (PROLIA) 60 MG/ML SOSY injection Inject 60 mg into the skin every 6 (six) months.    . ezetimibe (ZETIA) 10 MG tablet Take 1 tablet (10 mg total) by mouth daily. 90 tablet 3  . fluticasone (FLONASE) 50 MCG/ACT nasal spray Place into the nose daily as needed.     . furosemide (LASIX) 20 MG tablet Take 20 mg by mouth daily.    Marland Kitchen levalbuterol (XOPENEX) 0.63 MG/3ML nebulizer solution Take 0.63 mg by nebulization every 4 (four) hours as needed for wheezing or shortness of breath.    . lisinopril-hydrochlorothiazide (ZESTORETIC) 10-12.5 MG tablet Take 1 tablet by mouth daily. 90 tablet 3  . LORazepam (ATIVAN) 1 MG tablet Take 1 mg by mouth 2 (two) times daily.     . Magnesium 400 MG TABS Take by mouth daily.    . Omega-3 Fatty Acids (FISH OIL) 1000 MG CAPS Take 1 capsule by mouth daily.    . ondansetron (ZOFRAN) 4 MG tablet Take 1 tablet (4 mg total) by mouth every 8 (eight) hours as needed for nausea or vomiting. 90 tablet 3  . potassium chloride SA (KLOR-CON) 20 MEQ tablet Take 1 tablet (20 mEq total) by mouth daily. 90 tablet 3  . tamoxifen (NOLVADEX) 20 MG tablet Take 1 tablet (20 mg total) by mouth daily. 90 tablet 3   No current facility-administered medications for this visit.    OBJECTIVE: Vitals:   04/26/21 1022  BP: (!) 103/48  Pulse: 67  Resp: 17  Temp: 97.6 F (36.4 C)  SpO2: 98%     Body mass index is 22.99 kg/m.    ECOG FS:2 - Symptomatic, <50% confined to bed  General: Well-developed, well-nourished, no acute distress.  Sitting in a wheelchair. Eyes: Pink conjunctiva, anicteric sclera. HEENT: Normocephalic, moist mucous membranes. Breasts: Exam deferred today. Lungs: No audible wheezing or coughing. Heart: Regular rate and rhythm. Abdomen: Soft, nontender, no obvious distention. Musculoskeletal: No edema, cyanosis, or clubbing. Neuro: Alert, answering all questions appropriately. Cranial nerves grossly intact. Skin: No  rashes or petechiae noted. Psych: Normal affect.   LAB RESULTS:  Lab Results  Component Value Date   NA 140 01/23/2021   K 3.7 01/23/2021   CL 100 01/23/2021   CO2 34 (H) 01/23/2021   GLUCOSE 106 (H) 01/23/2021   BUN 27 (H) 01/23/2021   CREATININE 0.93 01/23/2021   CALCIUM 9.0 01/23/2021   PROT 6.3 01/23/2021   ALBUMIN 4.0 01/23/2021   AST 17 01/23/2021   ALT 11 01/23/2021   ALKPHOS 32 (L) 01/23/2021   BILITOT 1.1 01/23/2021   GFRNONAA >60 05/05/2019   GFRAA >60 05/05/2019    Lab Results  Component Value Date   WBC 6.1 01/23/2021   NEUTROABS 3.5 01/23/2021   HGB 13.1 01/23/2021   HCT 40.2 01/23/2021   MCV 92.0 01/23/2021   PLT  237.0 01/23/2021     STUDIES: MM DIAG BREAST TOMO UNI RIGHT  Result Date: 04/24/2021 CLINICAL DATA:  87 year old with a diagnosis of DCIS on core needle biopsy in October, 2021. As the patient is not a surgical candidate, she is currently undergoing hormonal chemoprevention with tamoxifen. Short-term interval follow-up to evaluate treatment response. EXAM: DIGITAL DIAGNOSTIC UNILATERAL RIGHT MAMMOGRAM WITH TOMOSYNTHESIS AND CAD TECHNIQUE: Right digital diagnostic mammography and breast tomosynthesis was performed. The images were evaluated with computer-aided detection. COMPARISON:  Previous exam(s). ACR Breast Density Category b: There are scattered areas of fibroglandular density. FINDINGS: Full field CC, MLO and laterally exaggerated CC views, as well as spot magnification CC and mediolateral views of the calcifications were obtained. The mixed fine and coarse heterogeneous calcifications in the outer breast at middle to posterior depth, associated with the coil shaped tissue marker clip, are unchanged when compared to the 09/21/2020 mammogram. The calcifications span approximately 1.8 x 0.7 x 1.1 cm (AP x mediolateral x craniocaudal), previously 1.8 x 0.6 x 1.0 cm. No new or suspicious findings elsewhere. IMPRESSION: Stable biopsy-proven DCIS  involving the outer RIGHT breast spanning maximally 1.8 cm. RECOMMENDATION: Annual BILATERAL diagnostic mammography to include spot magnification views of the RIGHT breast calcifications, due in October, 2022 I have discussed the findings and recommendations with the patient. If applicable, a reminder letter will be sent to the patient regarding the next appointment. BI-RADS CATEGORY  6: Known biopsy-proven malignancy. Electronically Signed   By: Evangeline Dakin M.D.   On: 04/24/2021 10:52    ASSESSMENT: DCIS, right breast.  PLAN:    1. DCIS, right breast: Pathology and imaging results reviewed independently.  Consultation with surgery determined that lumpectomy or mastectomy would be too high risk.  It was also determined that without undergoing surgery, XRT would offer little benefit.  Patient was initiated on tamoxifen November 2021.  Right diagnostic mammogram as above with essentially unchanged lesion.  Repeat bilateral mammogram in 6 months with follow-up 1 to 2 days later.    I spent a total of 20 minutes reviewing chart data, face-to-face evaluation with the patient, counseling and coordination of care as detailed above.  Patient expressed understanding and was in agreement with this plan. She also understands that She can call clinic at any time with any questions, concerns, or complaints.   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

## 2021-05-10 ENCOUNTER — Encounter: Payer: Self-pay | Admitting: Internal Medicine

## 2021-05-10 ENCOUNTER — Other Ambulatory Visit: Payer: Self-pay | Admitting: Internal Medicine

## 2021-05-10 ENCOUNTER — Ambulatory Visit (INDEPENDENT_AMBULATORY_CARE_PROVIDER_SITE_OTHER): Payer: Medicare Other | Admitting: Internal Medicine

## 2021-05-10 ENCOUNTER — Other Ambulatory Visit: Payer: Self-pay

## 2021-05-10 ENCOUNTER — Ambulatory Visit
Admission: RE | Admit: 2021-05-10 | Discharge: 2021-05-10 | Disposition: A | Discharge status: Home / Self Care | Payer: Medicare Other | Source: Ambulatory Visit | Attending: Internal Medicine | Admitting: Internal Medicine

## 2021-05-10 VITALS — BP 122/58 | HR 75 | Temp 97.8°F | Ht 63.0 in | Wt 126.8 lb

## 2021-05-10 DIAGNOSIS — N200 Calculus of kidney: Secondary | ICD-10-CM | POA: Diagnosis not present

## 2021-05-10 DIAGNOSIS — I700 Atherosclerosis of aorta: Secondary | ICD-10-CM | POA: Insufficient documentation

## 2021-05-10 DIAGNOSIS — Z85118 Personal history of other malignant neoplasm of bronchus and lung: Secondary | ICD-10-CM | POA: Diagnosis not present

## 2021-05-10 DIAGNOSIS — G8929 Other chronic pain: Secondary | ICD-10-CM

## 2021-05-10 DIAGNOSIS — D3502 Benign neoplasm of left adrenal gland: Secondary | ICD-10-CM | POA: Diagnosis not present

## 2021-05-10 DIAGNOSIS — R911 Solitary pulmonary nodule: Secondary | ICD-10-CM | POA: Diagnosis not present

## 2021-05-10 DIAGNOSIS — J9690 Respiratory failure, unspecified, unspecified whether with hypoxia or hypercapnia: Secondary | ICD-10-CM | POA: Diagnosis not present

## 2021-05-10 DIAGNOSIS — D0511 Intraductal carcinoma in situ of right breast: Secondary | ICD-10-CM

## 2021-05-10 DIAGNOSIS — M546 Pain in thoracic spine: Secondary | ICD-10-CM | POA: Diagnosis not present

## 2021-05-10 DIAGNOSIS — Z853 Personal history of malignant neoplasm of breast: Secondary | ICD-10-CM | POA: Diagnosis not present

## 2021-05-10 DIAGNOSIS — I2510 Atherosclerotic heart disease of native coronary artery without angina pectoris: Secondary | ICD-10-CM | POA: Diagnosis not present

## 2021-05-10 DIAGNOSIS — R0600 Dyspnea, unspecified: Secondary | ICD-10-CM | POA: Diagnosis not present

## 2021-05-10 DIAGNOSIS — J449 Chronic obstructive pulmonary disease, unspecified: Secondary | ICD-10-CM

## 2021-05-10 DIAGNOSIS — R600 Localized edema: Secondary | ICD-10-CM

## 2021-05-10 DIAGNOSIS — Z9981 Dependence on supplemental oxygen: Secondary | ICD-10-CM

## 2021-05-10 DIAGNOSIS — Q630 Accessory kidney: Secondary | ICD-10-CM | POA: Diagnosis not present

## 2021-05-10 DIAGNOSIS — I10 Essential (primary) hypertension: Secondary | ICD-10-CM | POA: Diagnosis not present

## 2021-05-10 DIAGNOSIS — M25511 Pain in right shoulder: Secondary | ICD-10-CM

## 2021-05-10 DIAGNOSIS — C50919 Malignant neoplasm of unspecified site of unspecified female breast: Secondary | ICD-10-CM | POA: Diagnosis not present

## 2021-05-10 DIAGNOSIS — E876 Hypokalemia: Secondary | ICD-10-CM | POA: Diagnosis not present

## 2021-05-10 LAB — BASIC METABOLIC PANEL
BUN: 31 mg/dL — ABNORMAL HIGH (ref 6–23)
CO2: 33 mEq/L — ABNORMAL HIGH (ref 19–32)
Calcium: 9.1 mg/dL (ref 8.4–10.5)
Chloride: 99 mEq/L (ref 96–112)
Creatinine, Ser: 0.96 mg/dL (ref 0.40–1.20)
GFR: 53.97 mL/min — ABNORMAL LOW (ref >60.00)
Glucose, Bld: 102 mg/dL — ABNORMAL HIGH (ref 70–99)
Potassium: 3.6 mEq/L (ref 3.5–5.1)
Sodium: 139 mEq/L (ref 135–145)

## 2021-05-10 LAB — MAGNESIUM: Magnesium: 1.6 mg/dL (ref 1.5–2.5)

## 2021-05-10 LAB — BRAIN NATRIURETIC PEPTIDE: Pro B Natriuretic peptide (BNP): 90 pg/mL (ref 0.0–100.0)

## 2021-05-10 NOTE — Progress Notes (Signed)
Chief Complaint  Patient presents with  . Follow-up  . Edema   F/u  B/l leg edema with h/o htn on lis 10-12.5 mg qd and lasix 20 mg qd norvasc was stopped due to leg swelling  C/o 4/10 low back pain from right shoulder blade to right low back with h/o breast cancer  Chronic n/v and right mid back pain on tamoxifen 20 mg qd for breast cancer reviewed this can be side effect    Review of Systems  Constitutional: Positive for weight loss.       Down 5 lbs  HENT: Positive for hearing loss.   Eyes: Negative for blurred vision.  Respiratory: Positive for shortness of breath.        Chronic sob on 2L today for copd  Cardiovascular: Positive for leg swelling.  Gastrointestinal: Negative for abdominal pain.  Musculoskeletal: Negative for falls and joint pain.  Skin: Negative for rash.  Neurological: Negative for headaches.  Psychiatric/Behavioral: Negative for depression and memory loss.   Past Medical History:  Diagnosis Date  . Anxiety   . Chicken pox   . COPD (chronic obstructive pulmonary disease) ([place])   . Essential tremor   . Hypertension   . Hypomagnesemia   . Irritable bowel syndrome (IBS)   . 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 ABDOMINAL HYSTERECTOMY    . APPENDECTOMY    . BREAST BIOPSY Right 10/05/2020   stereo bx, x-clip, positive DCIS  . CHOLECYSTECTOMY    . OVARIAN CYST REMOVAL    . SMALL INTESTINE SURGERY     Family History  Problem Relation Age of Onset  . Heart attack Mother   . Heart disease Mother   . Hypertension Mother   . Cancer Father        bladder smoker  . Heart disease Brother        MI in 7s   . Breast cancer Neg Hx    Social History   Socioeconomic History  . Marital status: Widowed    Spouse name: Not on file  . Number of children: Not on file  . Years of education: Not on file  . Highest  education level: Not on file  Occupational History  . Not on file  Tobacco Use  . Smoking status: Former Research scientist (life sciences)  . Smokeless tobacco: Never Used  Substance and Sexual Activity  . Alcohol use: No  . Drug use: No  . Sexual activity: Not Currently  Other Topics Concern  . Not on file  Social History Narrative   Has 4 children 3 girls and 1 boy   Former smoker    Lives in in [place] with white [employer]         Social Determinants of Health   Financial Resource Strain: Low Risk   . Difficulty of Paying Living Expenses: Not very hard  Food Insecurity: No Food Insecurity  . Worried About Charity fundraiser in the Last Year: Never true  . Ran Out of Food in the Last Year: Never true  Transportation Needs: No Transportation Needs  . Lack of Transportation (Medical): No  . Lack of Transportation (Non-Medical): No  Physical Activity: Not on file  Stress: No Stress Concern Present  . Feeling of Stress : Not at all  Social Connections: Unknown  . Frequency of Communication with  Friends and Family: Not on file  . Frequency of Social Gatherings with Friends and Family: More than three times a week  . Attends Religious Services: Not on file  . Active Member of Clubs or Organizations: Not on file  . Attends Archivist Meetings: Not on file  . Marital Status: Not on file  Intimate Partner Violence: Not At Risk  . Fear of Current or Ex-Partner: No  . Emotionally Abused: No  . Physically Abused: No  . Sexually Abused: No   Current Meds  Medication Sig  . acetaminophen (TYLENOL) 500 MG tablet Take 500 mg by mouth every 6 (six) hours as needed.  Marland Kitchen albuterol (VENTOLIN HFA) 108 (90 Base) MCG/ACT inhaler Inhale 1-2 puffs into the lungs every 6 (six) hours as needed for wheezing or shortness of breath.  . Budeson-Glycopyrrol-Formoterol (BREZTRI AEROSPHERE) 160-9-4.8 MCG/ACT AERO Inhale 2 puffs into the lungs in the morning and at bedtime.  . calcium  carbonate (OSCAL) 1500 (600 Ca) MG TABS tablet Take 1,500 mg by mouth 2 (two) times daily with a meal.   . Cholecalciferol 25 MCG (1000 UT) tablet Take 1,000 Units by mouth daily.   Marland Kitchen denosumab (PROLIA) 60 MG/ML SOSY injection Inject 60 mg into the skin every 6 (six) months.  . ezetimibe (ZETIA) 10 MG tablet Take 1 tablet (10 mg total) by mouth daily.  . fluticasone (FLONASE) 50 MCG/ACT nasal spray Place into the nose daily as needed.   . furosemide (LASIX) 20 MG tablet Take 20 mg by mouth daily.  Marland Kitchen levalbuterol (XOPENEX) 0.63 MG/3ML nebulizer solution Take 0.63 mg by nebulization every 4 (four) hours as needed for wheezing or shortness of breath.  . lisinopril-hydrochlorothiazide (ZESTORETIC) 10-12.5 MG tablet Take 1 tablet by mouth daily.  Marland Kitchen LORazepam (ATIVAN) 1 MG tablet Take 1 mg by mouth 2 (two) times daily.   . Magnesium 400 MG TABS Take by mouth daily.  . Omega-3 Fatty Acids (FISH OIL) 1000 MG CAPS Take 1 capsule by mouth daily.  . ondansetron (ZOFRAN) 4 MG tablet Take 1 tablet (4 mg total) by mouth every 8 (eight) hours as needed for nausea or vomiting.  . potassium chloride SA (KLOR-CON) 20 MEQ tablet Take 1 tablet (20 mEq total) by mouth daily.  . tamoxifen (NOLVADEX) 20 MG tablet Take 1 tablet (20 mg total) by mouth daily.   Allergies  Allergen Reactions  . Trelegy Ellipta [Fluticasone-Umeclidin-Vilant]     Shortness of breath  . Codeine Nausea And Vomiting  . Lipitor [Atorvastatin] Nausea And Vomiting  . Omeprazole     unknown  . Vicodin [Hydrocodone-Acetaminophen]     unknown  . Zyban [Bupropion]     unknown  . Mevacor [Lovastatin] Itching and Rash   Recent Results (from the past 2160 hour(s))  Brain natriuretic peptide     Status: None   Collection Time: 04/25/21 12:23 PM  Result Value Ref Range   B Natriuretic Peptide 98.2 0.0 - 100.0 pg/mL    Comment: Performed at [facility], [address] [place], [place] [address]  Basic Metabolic Panel (BMET)      Status: Abnormal   Collection Time: 05/10/21 12:10 PM  Result Value Ref Range   Sodium 139 135 - 145 mEq/L   Potassium 3.6 3.5 - 5.1 mEq/L   Chloride 99 96 - 112 mEq/L   CO2 33 (H) 19 - 32 mEq/L   Glucose, Bld 102 (H) 70 - 99 mg/dL   BUN 31 (H) 6 - 23  mg/dL   Creatinine, Ser 0.96 0.40 - 1.20 mg/dL   GFR 53.97 (L) >60.00 mL/min    Comment: Calculated using the CKD-EPI Creatinine Equation (2021)   Calcium 9.1 8.4 - 10.5 mg/dL  B Nat Peptide     Status: None   Collection Time: 05/10/21 12:10 PM  Result Value Ref Range   Pro B Natriuretic peptide (BNP) 90.0 0.0 - 100.0 pg/mL  Magnesium     Status: None   Collection Time: 05/10/21 12:10 PM  Result Value Ref Range   Magnesium 1.6 1.5 - 2.5 mg/dL   Objective  Body mass index is 22.46 kg/m. Wt Readings from Last 3 Encounters:  05/10/21 126 lb 12.8 oz (57.5 kg)  04/26/21 129 lb 12.8 oz (58.9 kg)  04/05/21 131 lb (59.4 kg)   Temp Readings from Last 3 Encounters:  05/10/21 97.8 F (36.6 C) (Temporal)  04/26/21 97.6 F (36.4 C)  01/09/21 97.9 F (36.6 C)   BP Readings from Last 3 Encounters:  05/10/21 (!) 122/58  04/26/21 (!) 103/48  03/13/21 108/66   Pulse Readings from Last 3 Encounters:  05/10/21 75  04/26/21 67  03/13/21 95    Physical Exam Vitals and nursing note reviewed.  Constitutional:      Appearance: Normal appearance. She is well-developed and well-groomed.  HENT:     Head: Normocephalic and atraumatic.  Eyes:     Pupils: Pupils are equal, round, and reactive to light.  Cardiovascular:     Rate and Rhythm: Normal rate and regular rhythm.     Heart sounds: Normal heart sounds. No murmur heard.   Pulmonary:     Effort: Pulmonary effort is normal.     Breath sounds: Normal breath sounds.  Abdominal:     General: Abdomen is flat. Bowel sounds are normal.  Musculoskeletal:     Right shoulder: Tenderness present.       Arms:  Skin:    General: Skin is warm and dry.  Neurological:     General: No  focal deficit present.     Mental Status: She is alert and oriented to person, place, and time. Mental status is at baseline.     Gait: Gait abnormal.     Comments: Slowed gait  Psychiatric:        Attention and Perception: Attention and perception normal.        Mood and Affect: Mood and affect normal.        Speech: Speech normal.        Behavior: Behavior normal. Behavior is cooperative.        Thought Content: Thought content normal.        Cognition and Memory: Cognition and memory normal.        Judgment: Judgment normal.   CT ABDOMEN PELVIS FINDINGS  Hepatobiliary: No focal abnormality in the liver on this study without intravenous contrast. Gallbladder is surgically absent. No intrahepatic or extrahepatic biliary dilation.  Pancreas: No focal mass lesion. No dilatation of the main duct. No intraparenchymal cyst. No peripancreatic edema.  Spleen: No splenomegaly. No focal mass lesion.  Adrenals/Urinary Tract: Right adrenal gland unremarkable. 2.2 cm left adrenal adenoma evident. Nonobstructing stone in the lower pole right kidney measures 4 mm. Cortical scarring noted in both kidneys with duplicated left intrarenal collecting system. No evidence for hydroureter. The urinary bladder appears normal for the degree of distention.  Stomach/Bowel: Stomach is unremarkable. No gastric wall thickening. No evidence of outlet obstruction. Duodenum is normally positioned as is  the ligament of Treitz. No small bowel wall thickening. No small bowel dilatation. The terminal ileum is normal. No gross colonic mass. No colonic wall thickening. Anastomotic staple line noted left colon.  Vascular/Lymphatic: There is abdominal aortic atherosclerosis without aneurysm. There is no gastrohepatic or hepatoduodenal ligament lymphadenopathy. No retroperitoneal or mesenteric lymphadenopathy. No pelvic sidewall lymphadenopathy.  Reproductive: Unremarkable  Other: No intraperitoneal  free fluid.  Musculoskeletal: No worrisome lytic or sclerotic osseous abnormality. Superior endplate compression deformity noted at T11 and T12, stable.  IMPRESSION: 1. No evidence for metastatic disease in the chest, abdomen, or pelvis. 2. Tiny bilateral pulmonary nodules measuring 4 mm or less in size. Given the history of breast cancer, attention on follow-up recommended. 3. 2.2 cm left adrenal adenoma. 4. 4 mm nonobstructing right renal stone. 5. Aortic Atherosclerosis (ICD10-I70.0) and Emphysema (ICD10-J43.9).  Assessment  Plan  History of right breast cancer on tamoxifen 20 mg qd likely causing n/v - Plan: 5/26/22CT Abdomen Pelvis Wo Contrast see above no reason for wt loss, no mets needs to f/u CT chest for b/l lung nodules with cancer hx  Leg edema - Plan: Basic Metabolic Panel (BMET), B Nat Peptide, Magnesium, US Venous Img Lower Bilateral On lis 10-12.5 mg qd and lasix 20 mg qd pro bnp 90 See CT chest above 05/10/21  Consider echo of heart   COPD, severe (HCC) on 2L o2 cont O2 requirement not changed - Plan: CT Chest Wo Contrast 05/10/21  F/u pulm Dr. 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 Grayland Ormond    Chronic right-sided thoracic back pain no etiology on CT chest ab pelvis - Plan: CT Chest Wo Contrast, CANCELED: CT Abdomen Pelvis Wo Contrast  Chronic right shoulder pain - Plan: DG Shoulder Right   htn controlled on lasix 20 mg qd and lis-hctz 20-12.5 mg qd   HM Flu10/14/21 -prevnar late 11/2020 and 1/2022and consider repeat pna 23 in 1 year 01/18/20 moderna 3/3 10/29/20 Tdap 02/09/16    Out of age window pap, colonoscopy mammo10/7/21 abnormal bx 10/02/20 + right DCIS breast cancer with est Dr. Grayland Ormond consider Dr. Barry Dienes in future as of 10/30/20 declines Also disc MRI b/l breast h/o he states she is not  candidate mammo sch 04/24/21 and appt Dr. Grayland Ormond 04/26/21 prolia 04/25/21  DUCTAL CARCINOMA IN SITU, HIGH-GRADE WITH COMEDONECROSIS, WITH  ASSOCIATED CALCIFICATIONS.  - NEGATIVE FOR INVASIVE MAMMARY CARCINOMA. Consider DEXAif has not hadordered dexa can have with mammogram -call to schedule  Former smokerwith COPD on cont O2   Osteoporosis with prolia shot today 10/26/20 next due 04/25/21   H/o Dr. Grayland Ormond GI Dr. Tiffany Kocher  Lung Dr. Rosita Kea Dr. Michaelene Song AE Provider: Dr. Olivia Mackie McLean-Scocuzza-Internal Medicine

## 2021-05-15 ENCOUNTER — Telehealth: Payer: Self-pay | Admitting: Internal Medicine

## 2021-05-15 NOTE — Telephone Encounter (Signed)
Patient called in wanted a after visit summary did not receive one on her last visit on 5/26 and would like some to explain her lab results to her. She is coming by to fill out out form to give her daughter permission to talk on her behave and to pick up the AVS this week

## 2021-05-15 NOTE — Telephone Encounter (Signed)
AVS and labs printed and placed upfront. Patient discussed labs with Azzell.   Will discuss with Patient when she comes in to pick up paperwork.

## 2021-05-22 ENCOUNTER — Ambulatory Visit (INDEPENDENT_AMBULATORY_CARE_PROVIDER_SITE_OTHER): Payer: Medicare Other | Admitting: Pharmacist

## 2021-05-22 ENCOUNTER — Telehealth: Payer: Self-pay | Admitting: Internal Medicine

## 2021-05-22 DIAGNOSIS — I10 Essential (primary) hypertension: Secondary | ICD-10-CM

## 2021-05-22 DIAGNOSIS — M810 Age-related osteoporosis without current pathological fracture: Secondary | ICD-10-CM

## 2021-05-22 DIAGNOSIS — E785 Hyperlipidemia, unspecified: Secondary | ICD-10-CM

## 2021-05-22 DIAGNOSIS — J449 Chronic obstructive pulmonary disease, unspecified: Secondary | ICD-10-CM | POA: Diagnosis not present

## 2021-05-22 DIAGNOSIS — D0511 Intraductal carcinoma in situ of right breast: Secondary | ICD-10-CM | POA: Diagnosis not present

## 2021-05-22 DIAGNOSIS — R600 Localized edema: Secondary | ICD-10-CM

## 2021-05-22 MED ORDER — FUROSEMIDE 20 MG PO TABS: 20.0000 mg | ORAL_TABLET | Freq: Every day | ORAL | 1 refills | Status: AC

## 2021-05-22 NOTE — Addendum Note (Signed)
Addended by: Orland Mustard on: 05/22/2021 09:29 AM   Modules accepted: Orders

## 2021-05-22 NOTE — Telephone Encounter (Signed)
lft pt a vm to call ofc about appts for echo and [place]. thanks

## 2021-05-22 NOTE — Patient Instructions (Addendum)
Kristen Bailey,   It was great talking to you today!  Here is what each medication is for:   Furosemide 20 mg - fluid pill Lisinopril/Hydrochlorothiazide 10/12.5 mg - blood pressure Ezetimibe 10 mg - cholesterol Tamoxifen 20 mg - breast cancer Lorazepam 1 mg - anxiety Potassium, Magnesium - supplements Breztri - breathing Albuterol - breathing  Prolia injection - bone strength  Call me with any questions or concerns!  Catie Darnelle Maffucci, PharmD [phone]    PATIENT GOALS: Goals Addressed              This Visit's Progress     Patient Stated   .  Medication Monitoring (pt-stated)        Patient Goals/Self-Care Activities . Over the next 90 days, patient will:  - take medications as prescribed check blood pressure periodically, document, and provide at future appointments collaborate with provider on medication access solutions       The patient verbalized understanding of instructions, educational materials, and care plan provided today and agreed to receive a mailed copy of patient instructions, educational materials, and care plan.   Plan: Telephone follow up appointment with care management team member scheduled for:  ~ 12 weeks  Catie Darnelle Maffucci, PharmD, [place], [place] Clinical Pharmacist [employer] [facility] [employer] [phone]

## 2021-05-22 NOTE — Chronic Care Management (AMB) (Signed)
Chronic Care Management Pharmacy Note  05/22/2021 Name:  Kristen Bailey MRN:  [id number] DOB:  Sep 19, 1933   Subjective: Kristen Bailey is an 87 y.o. year old female who is a primary patient of McLean-Scocuzza, Nino Glow, MD.  The CCM team was consulted for assistance with disease management and care coordination needs.    Engaged with patient by telephone for follow up visit in response to provider referral for pharmacy case management and/or care coordination services.   Consent to Services:  The patient was given information about Chronic Care Management services, agreed to services, and gave verbal consent prior to initiation of services.  Please see initial visit note for detailed documentation.   Patient Care Team: McLean-Scocuzza, Nino Glow, MD as PCP - General (Internal Medicine) De Hollingshead, RPH-CPP (Pharmacist) Theodore Demark, RN as Oncology Nurse Navigator Lloyd Huger, MD as Consulting Physician (Oncology)  Recent office visits:  3/29 - PCP; discussed possible nausea w/ tamoxifen, plan to discuss w/ Finnegan ordered dexa  5/10 - mammogram   5/11 - Prolia  5/26 - PCP, ordered BNP, elevated, ordered ECHO, [place] lower extremity  Recent consult visits:  3/28 - pulm Raul Del- continue [place], O2 2L at rest, 3-4 w/ ambulation  5/11 - KC walk in for bilateral swelling - start furosemide  5/12 - onc Finnegan - continue regimen  Hospital visits: None in previous 6 months  Objective:  Lab Results  Component Value Date   CREATININE 0.96 05/10/2021   CREATININE 0.93 01/23/2021   CREATININE 0.91 09/28/2020    Lab Results  Component Value Date   HGBA1C 5.6 08/17/2019   Last diabetic Eye exam: No results found for: HMDIABEYEEXA  Last diabetic Foot exam: No results found for: HMDIABFOOTEX      Component Value Date/Time   CHOL 215 (H) 01/23/2021 0925   TRIG 97.0 01/23/2021 0925   HDL 90.20 01/23/2021 0925   CHOLHDL 2 01/23/2021 0925    VLDL 19.4 01/23/2021 0925   LDLCALC 105 (H) 01/23/2021 0925    Hepatic Function Latest Ref Rng & Units 01/23/2021 09/28/2020 02/23/2020  Total Protein 6.0 - 8.3 g/dL 6.3 6.4 6.6  Albumin 3.5 - 5.2 g/dL 4.0 4.2 4.0  AST 0 - 37 U/L _0 ALT 0 - 35 U/L _1 Alk Phosphatase 39 - 117 U/L 32(L) 53 52  Total Bilirubin 0.2 - 1.2 mg/dL 1.1 1.2 1.0    Lab Results  Component Value Date/Time   TSH 2.11 09/28/2020 09:13 AM   TSH 1.21 08/17/2019 09:02 AM    CBC Latest Ref Rng & Units 01/23/2021 09/28/2020 02/23/2020  WBC 4.0 - 10.5 K/uL 6.1 5.6 5.9  Hemoglobin 12.0 - 15.0 g/dL 13.1 13.2 12.6  Hematocrit 36.0 - 46.0 % 40.2 39.8 37.8  Platelets 150.0 - 400.0 K/uL 237.0 269.0 292.0    Lab Results  Component Value Date/Time   VD25OH 46.86 08/17/2019 09:02 AM    Clinical ASCVD: Yes  The ASCVD Risk score (Whitley., et al., 2013) failed to calculate for the following reasons:   The 2013 ASCVD risk score is only valid for ages 73 to 66      Social History   Tobacco Use  Smoking Status Former Smoker  Smokeless Tobacco Never Used   BP Readings from Last 3 Encounters:  05/10/21 (!) 122/58  04/26/21 (!) 103/48  03/13/21 108/66   Pulse Readings from Last 3 Encounters:  05/10/21 75  04/26/21 67  03/13/21 95   Wt Readings from Last 3 Encounters:  05/10/21 126 lb 12.8 oz (57.5 kg)  04/26/21 129 lb 12.8 oz (58.9 kg)  04/05/21 131 lb (59.4 kg)    Assessment: Review of patient past medical history, allergies, medications, health status, including review of consultants reports, laboratory and other test data, was performed as part of comprehensive evaluation and provision of chronic care management services.   SDOH:  (Social Determinants of Health) assessments and interventions performed:  SDOH Interventions   Flowsheet Row Most Recent Value  SDOH Interventions   Financial Strain Interventions Other (Comment)  [manufacturer assistance]      CCM Care Plan  Allergies   Allergen Reactions  . Trelegy Ellipta [Fluticasone-Umeclidin-Vilant]     Shortness of breath  . Codeine Nausea And Vomiting  . Lipitor [Atorvastatin] Nausea And Vomiting  . Omeprazole     unknown  . Vicodin [Hydrocodone-Acetaminophen]     unknown  . Zyban [Bupropion]     unknown  . Mevacor [Lovastatin] Itching and Rash    Medications Reviewed Today    Reviewed by De Hollingshead, RPH-CPP (Pharmacist) on 05/22/21 at [place] List Status: <None>  Medication Order Taking? Sig Documenting Provider Last Dose Status Informant  acetaminophen (TYLENOL) 500 MG tablet [id number] Yes Take 500 mg by mouth every 6 (six) hours as needed. [provider] Taking Active Self  albuterol (VENTOLIN HFA) 108 (90 Base) MCG/ACT inhaler [id number] Yes Inhale 1-2 puffs into the lungs every 6 (six) hours as needed for wheezing or shortness of breath. [provider] Taking Active   Budeson-Glycopyrrol-Formoterol (BREZTRI AEROSPHERE) 160-9-4.8 MCG/ACT AERO [id number] Yes Inhale 2 puffs into the lungs in the morning and at bedtime. [provider] Taking Active   calcium carbonate (OSCAL) 1500 (600 Ca) MG TABS tablet [id number] Yes Take 1,500 mg by mouth 2 (two) times daily with a meal.  [provider] Taking Active Self  Cholecalciferol 25 MCG (1000 UT) tablet [id number] Yes Take 1,000 Units by mouth daily.  [provider] Taking Active Self           Med Note Josiah Lobo, [facility]   Tue Jun 24, 2017 11:09 AM)    denosumab (PROLIA) 60 MG/ML SOSY injection [id number] Yes Inject 60 mg into the skin every 6 (six) months. McLean-Scocuzza, Nino Glow, MD Taking Active   ezetimibe (ZETIA) 10 MG tablet [id number] Yes Take 1 tablet (10 mg total) by mouth daily. McLean-Scocuzza, Nino Glow, MD Taking Active   fluticasone [facility]) 50 MCG/ACT nasal spray [id number] Yes Place into the nose daily as needed.  [provider] Taking Active   furosemide (LASIX) 20 MG tablet [id number]  Yes Take 1 tablet (20 mg total) by mouth daily. McLean-Scocuzza, Nino Glow, MD Taking Active   levalbuterol Penne Lash) 0.63 MG/3ML nebulizer solution [id number] No Take 0.63 mg by nebulization every 4 (four) hours as needed for wheezing or shortness of breath.  Patient not taking: Reported on 05/22/2021   [provider] Not Taking Active   lisinopril-hydrochlorothiazide (ZESTORETIC) 10-12.5 MG tablet [id number] Yes Take 1 tablet by mouth daily. McLean-Scocuzza, Nino Glow, MD Taking Active   LORazepam (ATIVAN) 1 MG tablet [id number] Yes Take 1 mg by mouth 2 (two) times daily.  [provider] Taking Active Self           Med Note De Hollingshead   Tue May 22, 2021  9:10 AM)    Magnesium 400 MG TABS [id number] Yes Take by mouth daily. [provider] Taking Active   Omega-3 Fatty Acids (FISH OIL) 1000 MG CAPS [id number] Yes Take 1 capsule by mouth daily. [provider] Taking Active   ondansetron (ZOFRAN) 4 MG tablet [id number] Yes Take 1 tablet (4 mg total) by mouth every 8 (eight) hours as needed for nausea or vomiting. McLean-Scocuzza, Nino Glow, MD Taking Active   potassium chloride SA (KLOR-CON) 20 MEQ tablet [id number] Yes Take 1 tablet (20 mEq total) by mouth daily. McLean-Scocuzza, Nino Glow, MD Taking Active   tamoxifen (NOLVADEX) 20 MG tablet [id number] Yes Take 1 tablet (20 mg total) by mouth daily. Lloyd Huger, MD Taking Active           Patient Active Problem List   Diagnosis Date Noted  . Insomnia 10/30/2020  . History of right breast cancer 10/26/2020  . Supplemental oxygen dependent 10/10/2020  . 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 Osteoporosis 09/13/2020  . Hives 04/11/2020  . IBS (irritable bowel syndrome) 08/04/2019  . Hemorrhoids 08/04/2019  . Anxiety 08/04/2019  . Leg cramps 08/04/2019  . Hypomagnesemia 08/04/2019  . Nausea 08/04/2019  . Hypokalemia 08/04/2019  . PAD (peripheral artery  disease) ([place]) 11/22/2016  . Pain in limb 11/22/2016  . COPD, severe ([place]) 11/22/2016  . Hyperlipidemia 11/22/2016  . Essential hypertension 11/22/2016    Immunization History  Administered Date(s) Administered  . Fluad Quad(high Dose 65+) 09/28/2020  . Influenza-Unspecified 09/17/2012, 09/01/2015, 09/27/2019  . Moderna Sars-Covid-2 Vaccination 01/18/2020, 02/15/2020, 12/10/2020  . Pneumococcal Conjugate-13 01/23/2021  . Pneumococcal-Unspecified 01/23/2004  . Tdap 11/18/2014, 02/09/2016    Conditions to be addressed/monitored: HTN, HLD and COPD  Care Plan : Medication Management  Updates made by De Hollingshead, RPH-CPP since 05/22/2021 12:00 AM    Problem: Breast cancer, COPD, HLD     [place] Goal: Disease Progression Prevention   Start Date: 12/26/2020  This Visit's Progress: On track  Recent Progress: On track  Priority: High  Note:   Current Barriers:  . Unable to independently afford treatment regimen . Complex patient with multiple comorbidities including recent breast cancer diagnosis, osteoporosis, COPD, that place at high risk of disease progression  Pharmacist Clinical Goal(s):  Marland Kitchen Over the next 90 days, patient will achieve adherence to monitoring guidelines and medication adherence to achieve therapeutic efficacy through collaboration with PharmD and provider.   Interventions: . 1:1 collaboration with McLean-Scocuzza, Nino Glow, MD regarding development and update of comprehensive plan of care as evidenced by provider attestation and co-signature . Inter-disciplinary care team collaboration (see longitudinal plan of care) . Comprehensive medication review performed; medication list updated in electronic medical record  Health Maintenance: Marland Kitchen Very concerned about spots on her skin. Follow up with Dr. Nehemiah Massed as scheduled.  Marland Kitchen COVID vaccination x3 . Due for pneumonia, shingrix vaccine. Recommend moving forward.   Ductal Carcinoma in Situ, R  breast: . Managed; Follows w/ Dr. Grayland Ormond, placed on tamoxifen 60 mg daily, plan for 5 years of tx.  . Last mammogram 04/24/21 . Adrenal mass on recent CT chest/abdomen. Plan to f/u with Dr. Grayland Ormond about that.  . Does report some nausea. Taking ondansetron PRN nausea.  . Continue current regimen at this time along with collaboration with oncology.  Chronic Obstructive Pulmonary Disease: Marland Kitchen Moderately well managed; current treatment: Breztri 160/9/4.5 mcg 2 puffs BID, albuterol HFA PRN, using sporadically. Has not been needing nebulizer recently.  Marland Kitchen Receiving Breztri from [employer] patient assistance through 12/14/21 . GOLD Classification: likely D  given symptom burden . Continuous O2 per Raul Del - 2L at rest, 3L while walking. Patient reports this has been beneficial. Interested in lighter portable oxygen. Will discuss w/ Dr. Raul Del at next appointment.  . Recommended to continue current regimen at this time along with collaboration with pulmonary  Hypertension: . Controlled per last clinic reading; current treatment: lisinopril/HCTZ 10/12.5 mg QAM, furosemide 20 mg daily . Recently stopped amlodipine d/t LEE. Lower extremity ultra sound ordered, has not been scheduled yet. Patient has been wearing compression stockings to help with swelling. Does note dark spots on her legs, she is seeing dermatology . Home BP readings: not checking at home, notes that she does not have a home cuff. Reports that last time she was at the office,  . ECHO ordered d/t elevated BNP. Scheduled later this month.  . Recommended to continue current regimen. Discussed purchasing a home BP cuff at the pharmacy to allow for home monitoring. Advised periodic home BP readings.  . Requested refill on furosemide. Sent today.   Hyperlipidemia with aortic atherosclerosis: . Uncontrolled per last lab work, but more relaxed goal likely appropriate given age, comorbidities, and anticipated benefit; current treatment:  ezetimibe 10 mg daily   . Medications previously tried: atorvastatin, lovastatin d/t itching, rash, GI intolerance . Recommended to continue current regimen at this time.   Osteoporosis: . Appropriately managed; current regimen: Prolia Q66month; calcium + Vitamin D 3 daily. Last dose 04/25/21. . Encouraged to continue current regimen at this time.  Chronic diarrhea/nausea/constipation: . Hx gall bladder removal; patient notes previous appts w/ Dr. AVicente Males Notes that she has not been using cholestyramine recently. Takes a "gentle laxative" if she doesn't have a BM in ~1-2 weeks. Declines wanting to schedule follow up with GI.    .Marland KitchenRecommend to continue current regimen at this time  Supplements: Omega 3 fatty acids 1 g daily, magnesium. Discussed that 1 g dosing of OTC fish oil may not be providing significant benefit, and that she may discontinue if she would like a reduced pill burden.  Patient Goals/Self-Care Activities . Over the next 90 days, patient will:  - take medications as prescribed check blood pressure periodically, document, and provide at future appointments collaborate with provider on medication access solutions  Follow Up Plan: Telephone follow up appointment with care management team member scheduled for: ~12 weeks     Medication Assistance: Breztri [facility].  Enrollment ends 12/15/21  [place] [place] [id number]: [phone]: [phone]  Follow Up:  Patient agrees to Care Plan and Follow-up.  Plan: Telephone follow up appointment with care management team member scheduled for:  ~ 12 weeks  Catie TDarnelle Maffucci PharmD, [place] CKentonClinical Pharmacist LOccidental [facility] [employer] [phone]

## 2021-05-23 ENCOUNTER — Ambulatory Visit
Admission: RE | Admit: 2021-05-23 | Discharge: 2021-05-23 | Disposition: A | Discharge status: Home / Self Care | Payer: Medicare Other | Source: Ambulatory Visit | Attending: Internal Medicine | Admitting: Internal Medicine

## 2021-05-23 ENCOUNTER — Other Ambulatory Visit: Payer: Self-pay

## 2021-05-23 DIAGNOSIS — R600 Localized edema: Secondary | ICD-10-CM | POA: Diagnosis not present

## 2021-05-24 ENCOUNTER — Ambulatory Visit (INDEPENDENT_AMBULATORY_CARE_PROVIDER_SITE_OTHER): Payer: Medicare Other | Admitting: Dermatology

## 2021-05-24 DIAGNOSIS — L578 Other skin changes due to chronic exposure to nonionizing radiation: Secondary | ICD-10-CM

## 2021-05-24 DIAGNOSIS — D1800 Hemangioma unspecified site: Secondary | ICD-10-CM | POA: Diagnosis not present

## 2021-05-24 DIAGNOSIS — L570 Actinic keratosis: Secondary | ICD-10-CM | POA: Diagnosis not present

## 2021-05-24 DIAGNOSIS — D692 Other nonthrombocytopenic purpura: Secondary | ICD-10-CM

## 2021-05-24 DIAGNOSIS — D229 Melanocytic nevi, unspecified: Secondary | ICD-10-CM

## 2021-05-24 DIAGNOSIS — L820 Inflamed seborrheic keratosis: Secondary | ICD-10-CM

## 2021-05-24 DIAGNOSIS — L814 Other melanin hyperpigmentation: Secondary | ICD-10-CM | POA: Diagnosis not present

## 2021-05-24 DIAGNOSIS — J449 Chronic obstructive pulmonary disease, unspecified: Secondary | ICD-10-CM | POA: Diagnosis not present

## 2021-05-24 DIAGNOSIS — Z1283 Encounter for screening for malignant neoplasm of skin: Secondary | ICD-10-CM

## 2021-05-24 DIAGNOSIS — L821 Other seborrheic keratosis: Secondary | ICD-10-CM

## 2021-05-24 NOTE — Patient Instructions (Signed)

## 2021-05-24 NOTE — Progress Notes (Signed)
Follow-Up Visit   Subjective  Kristen Bailey is a 87 y.o. female who presents for the following: Annual Exam (HX of low grade carcinoma in 2009). The patient presents for Total-Body Skin Exam (TBSE) for skin cancer screening and mole check.  The following portions of the chart were reviewed this encounter and updated as appropriate:   Tobacco  Allergies  Meds  Problems  Med Hx  Surg Hx  Fam Hx      Review of Systems:  No other skin or systemic complaints except as noted in HPI or Assessment and Plan.  Objective  Well appearing patient in no apparent distress; mood and affect are within normal limits.  A full examination was performed including scalp, head, eyes, ears, nose, lips, neck, chest, axillae, abdomen, back, buttocks, bilateral upper extremities, bilateral lower extremities, hands, feet, fingers, toes, fingernails, and toenails. All findings within normal limits unless otherwise noted below.  Face x 9 (9) Erythematous thin papules/macules with gritty scale.   L prox forearm x 2 for a total of 2 (2) Erythematous keratotic or waxy stuck-on papule or plaque.   Assessment & Plan  AK (actinic keratosis) (9) Face x 9  Destruction of lesion - Face x 9 Complexity: simple   Destruction method: cryotherapy   Informed consent: discussed and consent obtained   Timeout:  patient name, date of birth, surgical site, and procedure verified Lesion destroyed using liquid nitrogen: Yes   Region frozen until ice ball extended beyond lesion: Yes   Outcome: patient tolerated procedure well with no complications   Post-procedure details: wound care instructions given    Inflamed seborrheic keratosis L prox forearm x 2 for a total of 2  If not resolved in 6 weeks RTC for bx's.  Destruction of lesion - L prox forearm x 2 for a total of 2 Complexity: simple   Destruction method: cryotherapy   Informed consent: discussed and consent obtained   Timeout:  patient name, date  of birth, surgical site, and procedure verified Lesion destroyed using liquid nitrogen: Yes   Region frozen until ice ball extended beyond lesion: Yes   Outcome: patient tolerated procedure well with no complications   Post-procedure details: wound care instructions given    Skin cancer screening  Lentigines - Scattered tan macules - Due to sun exposure - Benign-appering, observe - Recommend daily broad spectrum sunscreen SPF 30+ to sun-exposed areas, reapply every 2 hours as needed. - Call for any changes  Seborrheic Keratoses - Stuck-on, waxy, tan-brown papules and/or plaques  - Benign-appearing - Discussed benign etiology and prognosis. - Observe - Call for any changes  Melanocytic Nevi - Tan-brown and/or pink-flesh-colored symmetric macules and papules - Benign appearing on exam today - Observation - Call clinic for new or changing moles - Recommend daily use of broad spectrum spf 30+ sunscreen to sun-exposed areas.   Hemangiomas - Red papules - Discussed benign nature - Observe - Call for any changes  Actinic Damage - Chronic condition, secondary to cumulative UV/sun exposure - diffuse scaly erythematous macules with underlying dyspigmentation - Recommend daily broad spectrum sunscreen SPF 30+ to sun-exposed areas, reapply every 2 hours as needed.  - Staying in the shade or wearing Esguerra sleeves, sun glasses (UVA+UVB protection) and wide brim hats (4-inch brim around the entire circumference of the hat) are also recommended for sun protection.  - Call for new or changing lesions.  Purpura - Chronic; persistent and recurrent.  Treatable, but not curable. - Violaceous macules and  patches - Benign - Related to trauma, age, sun damage and/or use of blood thinners, chronic use of topical and/or oral steroids - Observe - Can use OTC arnica containing moisturizer such as Dermend Bruise Formula if desired - Call for worsening or other concerns  Skin cancer screening  performed today.  Return in about 1 year (around 05/24/2022) for TBSE.  Luther Redo, CMA, am acting as scribe for Sarina Ser, MD .  Documentation: I have reviewed the above documentation for accuracy and completeness, and I agree with the above.  Sarina Ser, MD

## 2021-05-28 ENCOUNTER — Encounter: Payer: Self-pay | Admitting: Dermatology

## 2021-06-07 ENCOUNTER — Other Ambulatory Visit: Payer: Self-pay

## 2021-06-07 ENCOUNTER — Ambulatory Visit
Admission: RE | Admit: 2021-06-07 | Discharge: 2021-06-07 | Disposition: A | Discharge status: Home / Self Care | Payer: Medicare Other | Source: Ambulatory Visit | Attending: Internal Medicine | Admitting: Internal Medicine

## 2021-06-07 DIAGNOSIS — R600 Localized edema: Secondary | ICD-10-CM | POA: Diagnosis not present

## 2021-06-07 DIAGNOSIS — I10 Essential (primary) hypertension: Secondary | ICD-10-CM

## 2021-06-07 DIAGNOSIS — R0600 Dyspnea, unspecified: Secondary | ICD-10-CM | POA: Insufficient documentation

## 2021-06-07 DIAGNOSIS — I119 Hypertensive heart disease without heart failure: Secondary | ICD-10-CM | POA: Insufficient documentation

## 2021-06-07 DIAGNOSIS — J449 Chronic obstructive pulmonary disease, unspecified: Secondary | ICD-10-CM | POA: Diagnosis not present

## 2021-06-07 LAB — ECHOCARDIOGRAM COMPLETE: S' Lateral: 2.13 cm

## 2021-06-07 NOTE — Progress Notes (Signed)
*  PRELIMINARY RESULTS* Echocardiogram 2D Echocardiogram has been performed.  Kristen Bailey 06/07/2021, 10:38 AM

## 2021-06-23 DIAGNOSIS — J449 Chronic obstructive pulmonary disease, unspecified: Secondary | ICD-10-CM | POA: Diagnosis not present

## 2021-06-27 ENCOUNTER — Encounter: Payer: Medicare Other | Admitting: Dermatology

## 2021-07-11 ENCOUNTER — Telehealth: Payer: Self-pay | Admitting: Internal Medicine

## 2021-07-11 ENCOUNTER — Ambulatory Visit: Payer: Medicare Other | Admitting: Internal Medicine

## 2021-07-11 NOTE — Telephone Encounter (Signed)
Patient no-showed today's appointment; appointment was for 07/11/21, provider notified for review of record. Dr Olivia Mackie McLean-Scocuzza declines charging now show fee for second now show.   Letter sent for patient to call in and re-schedule.

## 2021-07-13 ENCOUNTER — Ambulatory Visit: Payer: Medicare Other | Admitting: Internal Medicine

## 2021-07-20 ENCOUNTER — Ambulatory Visit: Payer: Medicare Other | Admitting: Internal Medicine

## 2021-07-23 NOTE — Telephone Encounter (Signed)
Pt called she was not pleased with the mix up on the no show. She stated that she will find another doctor and will not be coming back here Dr. Olivia Mackie taken off as PCP

## 2021-07-24 DIAGNOSIS — J449 Chronic obstructive pulmonary disease, unspecified: Secondary | ICD-10-CM | POA: Diagnosis not present

## 2021-07-24 NOTE — Telephone Encounter (Signed)
Noted,  Anything needed for self dismissal?

## 2021-07-24 NOTE — Telephone Encounter (Signed)
We did not charge a no show that I am aware but pt must have had appts and no shown  Its up to her if she wants to come back or not but make aware of no show policy

## 2021-07-25 NOTE — Telephone Encounter (Signed)
Letter was sent informing Patient that there was no fee charged. Also documented earlier in the encounter that no fee was charged.

## 2021-08-01 DIAGNOSIS — R0602 Shortness of breath: Secondary | ICD-10-CM | POA: Diagnosis not present

## 2021-08-01 DIAGNOSIS — J439 Emphysema, unspecified: Secondary | ICD-10-CM | POA: Diagnosis not present

## 2021-08-01 DIAGNOSIS — D0511 Intraductal carcinoma in situ of right breast: Secondary | ICD-10-CM | POA: Diagnosis not present

## 2021-08-01 DIAGNOSIS — Z9981 Dependence on supplemental oxygen: Secondary | ICD-10-CM | POA: Diagnosis not present

## 2021-08-14 ENCOUNTER — Ambulatory Visit: Payer: Medicare Other | Admitting: Pharmacist

## 2021-08-14 ENCOUNTER — Telehealth: Payer: Medicare Other

## 2021-08-14 DIAGNOSIS — I10 Essential (primary) hypertension: Secondary | ICD-10-CM

## 2021-08-14 DIAGNOSIS — E785 Hyperlipidemia, unspecified: Secondary | ICD-10-CM

## 2021-08-14 DIAGNOSIS — J449 Chronic obstructive pulmonary disease, unspecified: Secondary | ICD-10-CM

## 2021-08-14 DIAGNOSIS — I739 Peripheral vascular disease, unspecified: Secondary | ICD-10-CM

## 2021-08-14 NOTE — Chronic Care Management (AMB) (Signed)
   08/14/2021  Isaura Leyman Whittaker 05-08-1933 [id number]  Upon chart review for CCM visit today, noted that patient is establishing with a new PCP. Closing out CCM case.   Catie Darnelle Maffucci, PharmD, [place], [place] Clinical Pharmacist [employer] at [place]

## 2021-08-16 DEATH — deceased

## 2021-08-30 ENCOUNTER — Ambulatory Visit: Payer: Medicare Other | Admitting: Surgery

## 2021-10-30 ENCOUNTER — Ambulatory Visit: Payer: Medicare Other | Admitting: Oncology

## 2022-04-21 IMAGING — MG MM BREAST BX W/ LOC DEV 1ST LESION IMAGE BX SPEC STEREO GUIDE*R*
7 series · 8 of 19 positions shown · non-contrast
Comparison: Previous exams.
COMPARISON: Previous exams.

Addendum:
CLINICAL DATA: 86-year-old female for tissue sampling of OUTER
RIGHT breast calcifications.

EXAM:
RIGHT BREAST STEREOTACTIC CORE NEEDLE BIOPSY

[R (1 of 4)]
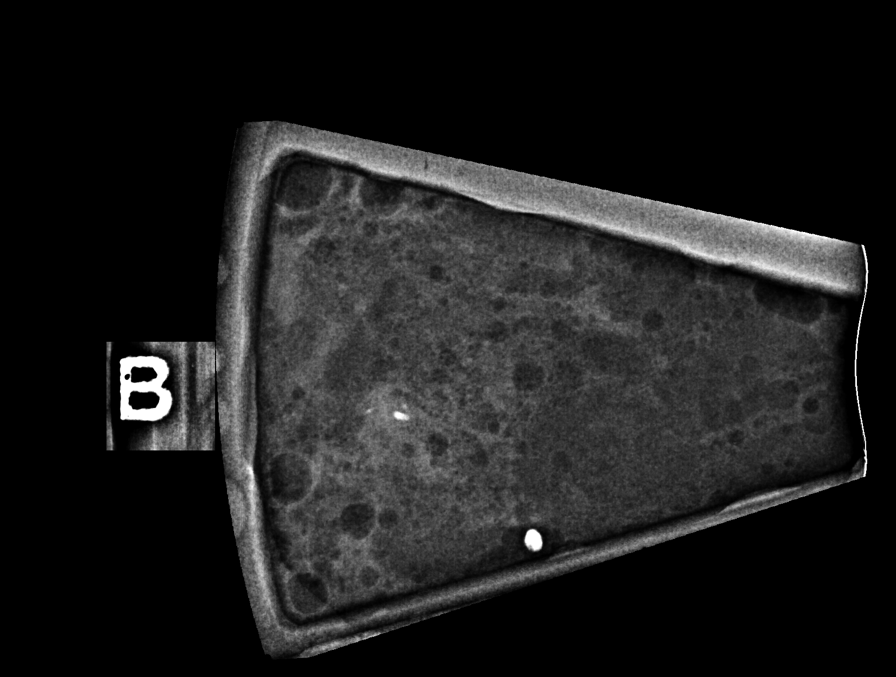

[R (2 of 4)]
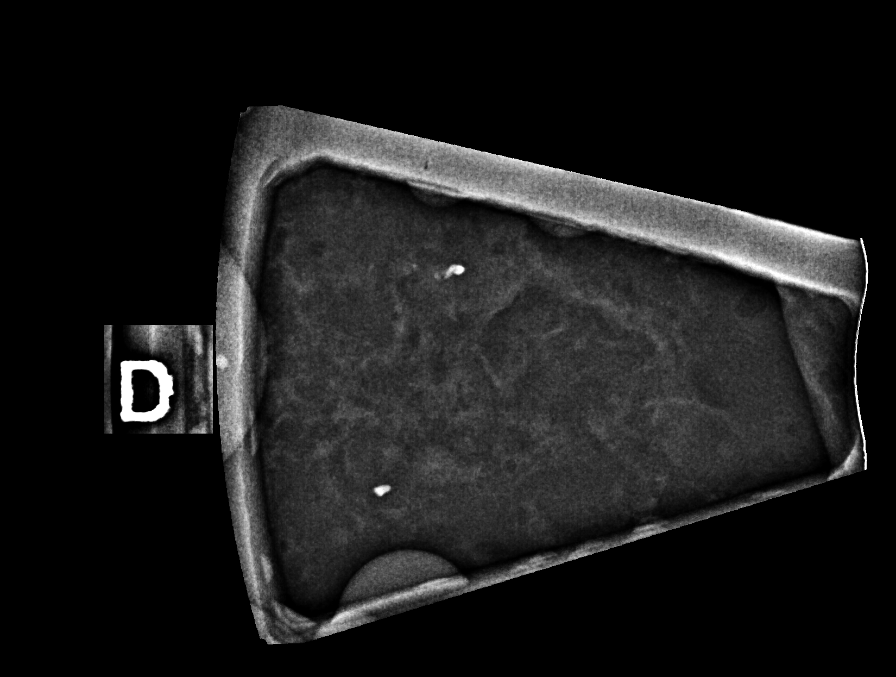

[R (3 of 4)]
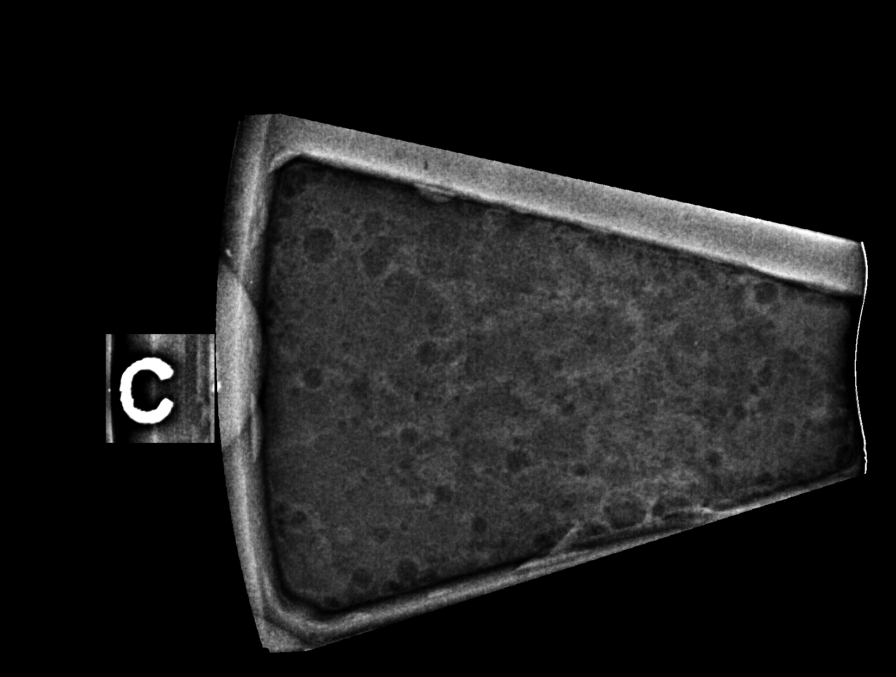

[R (4 of 4)]
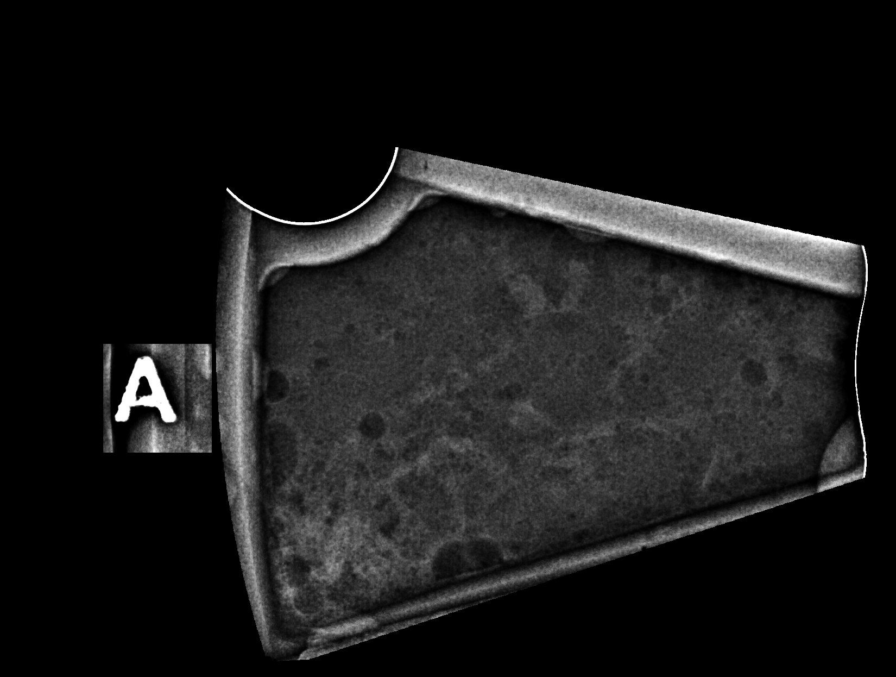

[R LM tomo · 2 of 39 frames shown (1 of 3)]
[frame 13/39]
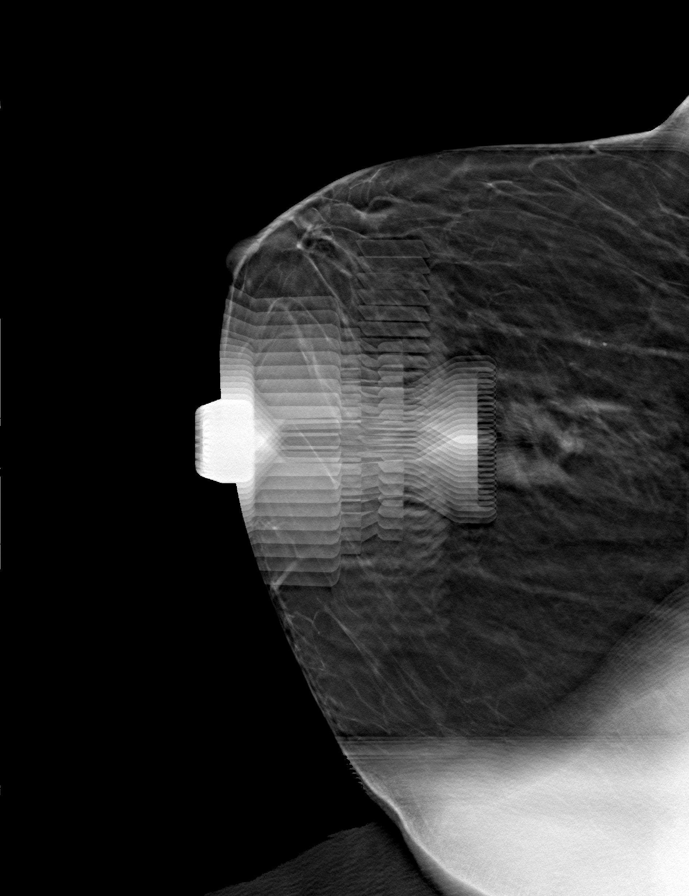
[frame 20/39]
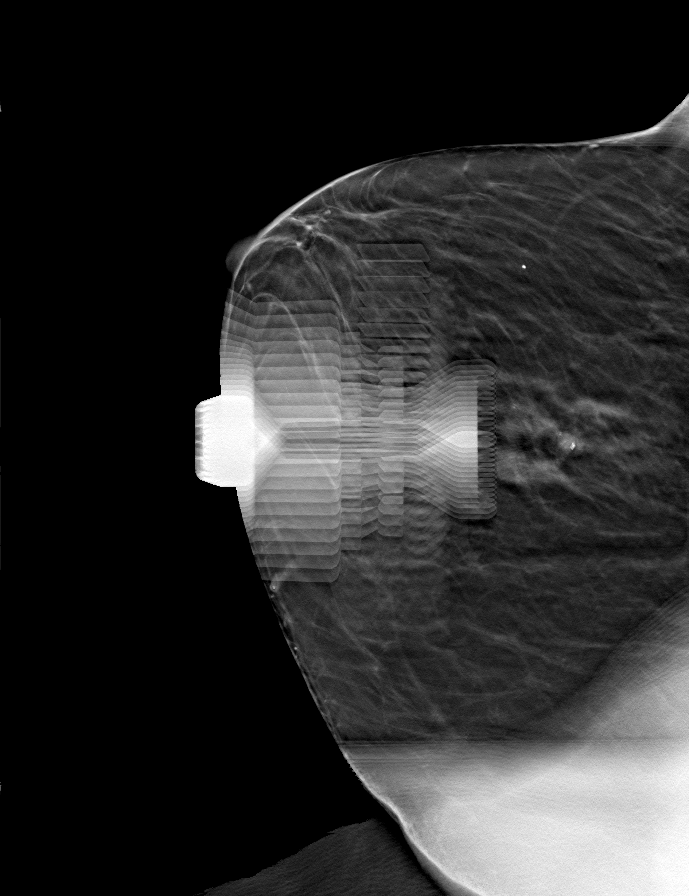

[R LM tomo (2 of 3) · tomo slice 20/39.0]
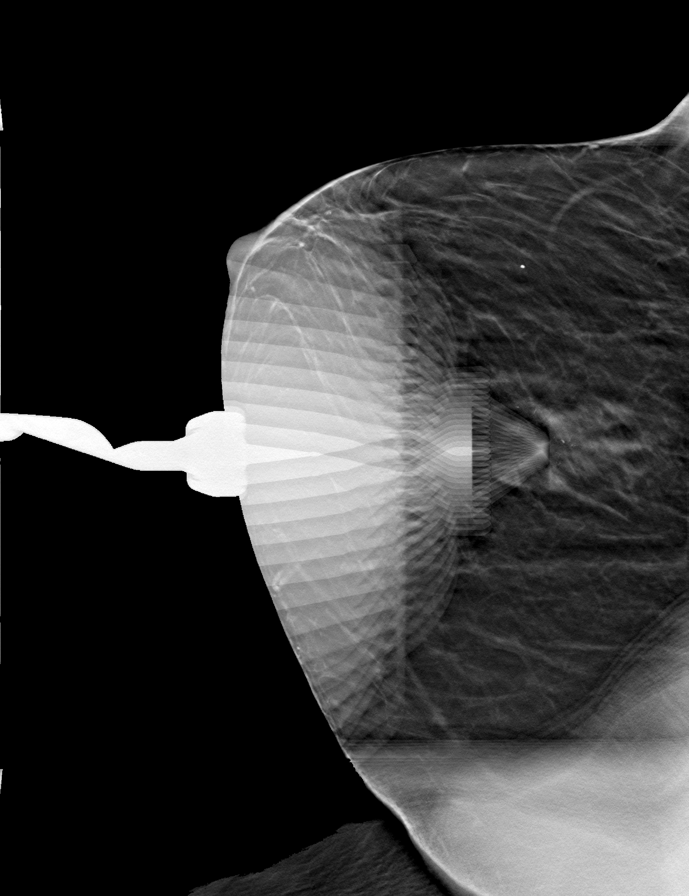

[R LM tomo (3 of 3) · tomo slice 20/39.0]
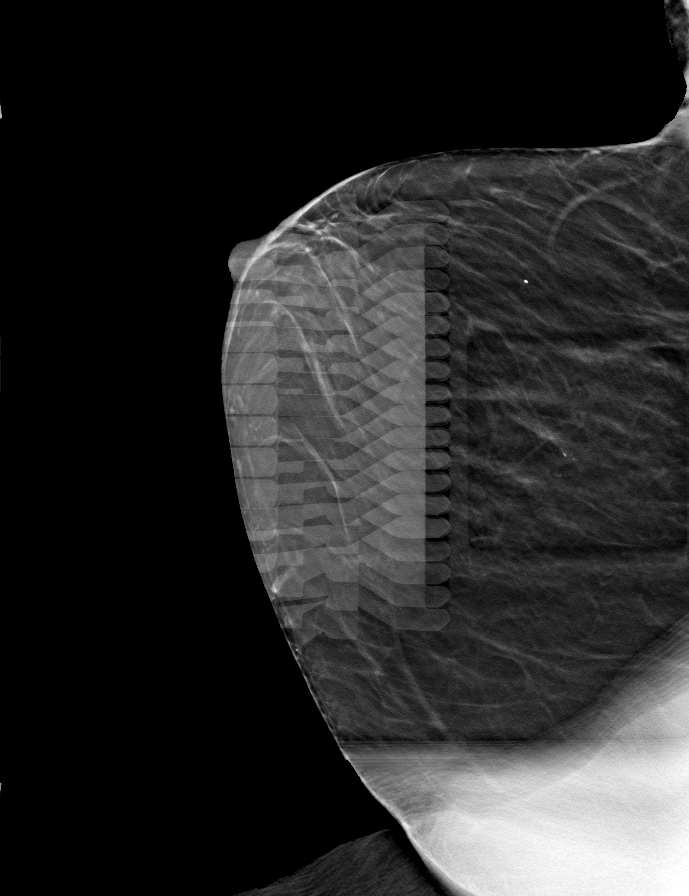

[8 of 19 positions shown; findings below may reference images not displayed]



Using sterile technique and 1% Lidocaine as local anesthetic, under
stereotactic guidance, a 9 gauge vacuum assisted device was used to
perform core needle biopsy of calcifications within the OUTER RIGHT
breast using a LATERAL approach. Specimen radiograph was performed
showing calcifications. Specimens with calcifications are identified
for pathology.

At the conclusion of the procedure, a COIL tissue marker clip was
deployed into the biopsy cavity. Follow-up 2-view mammogram was
performed and dictated separately.
IMPRESSION: Stereotactic-guided biopsy of OUTER RIGHT breast calcifications. No
apparent complications.

ADDENDUM:
PATHOLOGY revealed: A. BREAST, RIGHT, OUTER; STEREOTACTIC CORE
NEEDLE BIOPSY: - DUCTAL CARCINOMA IN SITU, HIGH-GRADE WITH
COMEDONECROSIS, WITH ASSOCIATED CALCIFICATIONS. - NEGATIVE FOR
INVASIVE MAMMARY CARCINOMA. Comment: DCIS is present in 2 of 3
tissue blocks, and spans a distance of at least 2 mm in greatest
extent.

Pathology results are CONCORDANT with imaging findings, per Dr. Heysel
Sutcu.

Pathology results and recommendations below were discussed with
patient by telephone on 10/06/2020. Patient reported biopsy site
within normal limits with slight tenderness at the site. Post biopsy
care instructions were reviewed, questions were answered and my
direct phone number was provided to patient. Patient was instructed
to call [HOSPITAL] if any concerns or questions arise
related to the biopsy.

Recommend 1. Surgical consultation: Request for surgical
consultation relayed to Kristyan Balentine RN and Chistrian Ramaycuna RN at
[HOSPITAL] [HOSPITAL] by Pacifik Felicite RN on 10/06/2020.

2.  Consider bilateral breast MRI due to high grade DCIS.

Pathology results reported by Pacifik Felicite RN on 10/06/2020.



Using sterile technique and 1% Lidocaine as local anesthetic, under
stereotactic guidance, a 9 gauge vacuum assisted device was used to
perform core needle biopsy of calcifications within the OUTER RIGHT
breast using a LATERAL approach. Specimen radiograph was performed
showing calcifications. Specimens with calcifications are identified
for pathology.

At the conclusion of the procedure, a COIL tissue marker clip was
deployed into the biopsy cavity. Follow-up 2-view mammogram was
performed and dictated separately.
IMPRESSION: Stereotactic-guided biopsy of OUTER RIGHT breast calcifications. No
apparent complications.

## 2022-05-30 ENCOUNTER — Encounter: Payer: Medicare Other | Admitting: Dermatology
